# Patient Record
Sex: Female | Born: 1940 | Hispanic: Yes | Smoking: Never smoker
Health system: Southern US, Community
[De-identification: ages and names within clinical notes are randomized; demographics above are authoritative.]

## PROBLEM LIST (undated history)

## (undated) DIAGNOSIS — I700 Atherosclerosis of aorta: Secondary | ICD-10-CM

## (undated) DIAGNOSIS — Z972 Presence of dental prosthetic device (complete) (partial): Secondary | ICD-10-CM

## (undated) DIAGNOSIS — M810 Age-related osteoporosis without current pathological fracture: Secondary | ICD-10-CM

## (undated) DIAGNOSIS — E7800 Pure hypercholesterolemia, unspecified: Secondary | ICD-10-CM

## (undated) DIAGNOSIS — I739 Peripheral vascular disease, unspecified: Secondary | ICD-10-CM

## (undated) DIAGNOSIS — I10 Essential (primary) hypertension: Secondary | ICD-10-CM

## (undated) HISTORY — PX: JOINT REPLACEMENT: SHX530

## (undated) HISTORY — PX: ABDOMINAL HYSTERECTOMY: SHX81

## (undated) HISTORY — PX: CHOLECYSTECTOMY: SHX55

---

## 2019-06-27 DIAGNOSIS — I10 Essential (primary) hypertension: Secondary | ICD-10-CM | POA: Diagnosis present

## 2019-06-27 DIAGNOSIS — M810 Age-related osteoporosis without current pathological fracture: Secondary | ICD-10-CM | POA: Diagnosis present

## 2019-06-27 DIAGNOSIS — I739 Peripheral vascular disease, unspecified: Secondary | ICD-10-CM | POA: Insufficient documentation

## 2020-02-08 DIAGNOSIS — I700 Atherosclerosis of aorta: Secondary | ICD-10-CM | POA: Insufficient documentation

## 2020-02-28 ENCOUNTER — Other Ambulatory Visit: Payer: Self-pay | Admitting: Orthopedic Surgery

## 2020-02-28 DIAGNOSIS — M75101 Unspecified rotator cuff tear or rupture of right shoulder, not specified as traumatic: Secondary | ICD-10-CM

## 2020-03-07 ENCOUNTER — Other Ambulatory Visit: Payer: Self-pay | Admitting: Internal Medicine

## 2020-03-07 DIAGNOSIS — Z1231 Encounter for screening mammogram for malignant neoplasm of breast: Secondary | ICD-10-CM

## 2020-03-18 ENCOUNTER — Ambulatory Visit
Admission: RE | Admit: 2020-03-18 | Discharge: 2020-03-18 | Disposition: A | Discharge status: Home / Self Care | Payer: Medicare Other | Source: Ambulatory Visit | Attending: Orthopedic Surgery | Admitting: Orthopedic Surgery

## 2020-03-18 ENCOUNTER — Other Ambulatory Visit: Payer: Self-pay

## 2020-03-18 DIAGNOSIS — M75101 Unspecified rotator cuff tear or rupture of right shoulder, not specified as traumatic: Secondary | ICD-10-CM | POA: Insufficient documentation

## 2020-03-29 ENCOUNTER — Other Ambulatory Visit: Payer: Self-pay

## 2020-03-29 ENCOUNTER — Ambulatory Visit
Admission: RE | Admit: 2020-03-29 | Discharge: 2020-03-29 | Disposition: A | Discharge status: Home / Self Care | Payer: Medicare Other | Source: Ambulatory Visit | Attending: Internal Medicine | Admitting: Internal Medicine

## 2020-03-29 DIAGNOSIS — Z1231 Encounter for screening mammogram for malignant neoplasm of breast: Secondary | ICD-10-CM | POA: Insufficient documentation

## 2021-09-14 ENCOUNTER — Other Ambulatory Visit: Payer: Self-pay

## 2021-09-14 ENCOUNTER — Observation Stay
Admission: EM | Admit: 2021-09-14 | Discharge: 2021-09-15 | Disposition: A | Discharge status: Home / Self Care | Payer: Medicare (Managed Care) | Attending: Obstetrics and Gynecology | Admitting: Obstetrics and Gynecology

## 2021-09-14 ENCOUNTER — Encounter: Payer: Self-pay | Admitting: Emergency Medicine

## 2021-09-14 ENCOUNTER — Emergency Department: Payer: Medicare (Managed Care)

## 2021-09-14 DIAGNOSIS — U071 COVID-19: Secondary | ICD-10-CM | POA: Insufficient documentation

## 2021-09-14 DIAGNOSIS — M810 Age-related osteoporosis without current pathological fracture: Secondary | ICD-10-CM | POA: Insufficient documentation

## 2021-09-14 DIAGNOSIS — N179 Acute kidney failure, unspecified: Secondary | ICD-10-CM | POA: Diagnosis present

## 2021-09-14 DIAGNOSIS — D649 Anemia, unspecified: Secondary | ICD-10-CM | POA: Insufficient documentation

## 2021-09-14 DIAGNOSIS — I10 Essential (primary) hypertension: Secondary | ICD-10-CM | POA: Insufficient documentation

## 2021-09-14 DIAGNOSIS — R55 Syncope and collapse: Principal | ICD-10-CM

## 2021-09-14 HISTORY — DX: Essential (primary) hypertension: I10

## 2021-09-14 LAB — CBC WITH DIFFERENTIAL/PLATELET
Abs Immature Granulocytes: 0.03 10*3/uL (ref 0.00–0.07)
Basophils Absolute: 0 10*3/uL (ref 0.0–0.1)
Basophils Relative: 0 %
Eosinophils Absolute: 0 10*3/uL (ref 0.0–0.5)
Eosinophils Relative: 0 %
HCT: 36.6 % (ref 36.0–46.0)
Hemoglobin: 11.3 g/dL — ABNORMAL LOW (ref 12.0–15.0)
Immature Granulocytes: 0 %
Lymphocytes Relative: 20 %
Lymphs Abs: 1.5 10*3/uL (ref 0.7–4.0)
MCH: 23.1 pg — ABNORMAL LOW (ref 26.0–34.0)
MCHC: 30.9 g/dL (ref 30.0–36.0)
MCV: 74.7 fL — ABNORMAL LOW (ref 80.0–100.0)
Monocytes Absolute: 1.1 10*3/uL — ABNORMAL HIGH (ref 0.1–1.0)
Monocytes Relative: 15 %
Neutro Abs: 4.7 10*3/uL (ref 1.7–7.7)
Neutrophils Relative %: 65 %
Platelets: 177 10*3/uL (ref 150–400)
RBC: 4.9 MIL/uL (ref 3.87–5.11)
RDW: 15.4 % (ref 11.5–15.5)
WBC: 7.3 10*3/uL (ref 4.0–10.5)
nRBC: 0 % (ref 0.0–0.2)

## 2021-09-14 LAB — COMPREHENSIVE METABOLIC PANEL
ALT: 28 U/L (ref 0–44)
AST: 38 U/L (ref 15–41)
Albumin: 3.4 g/dL — ABNORMAL LOW (ref 3.5–5.0)
Alkaline Phosphatase: 50 U/L (ref 38–126)
Anion gap: 4 — ABNORMAL LOW (ref 5–15)
BUN: 21 mg/dL (ref 8–23)
CO2: 26 mmol/L (ref 22–32)
Calcium: 8.5 mg/dL — ABNORMAL LOW (ref 8.9–10.3)
Chloride: 108 mmol/L (ref 98–111)
Creatinine, Ser: 1.36 mg/dL — ABNORMAL HIGH (ref 0.44–1.00)
GFR, Estimated: 40 mL/min — ABNORMAL LOW (ref >60)
Glucose, Bld: 188 mg/dL — ABNORMAL HIGH (ref 70–99)
Potassium: 3.5 mmol/L (ref 3.5–5.1)
Sodium: 138 mmol/L (ref 135–145)
Total Bilirubin: 0.5 mg/dL (ref 0.3–1.2)
Total Protein: 6.5 g/dL (ref 6.5–8.1)

## 2021-09-14 LAB — TROPONIN I (HIGH SENSITIVITY)
Troponin I (High Sensitivity): 31 ng/L — ABNORMAL HIGH (ref <18)
Troponin I (High Sensitivity): 24 ng/L — ABNORMAL HIGH (ref <18)

## 2021-09-14 MED ORDER — HYDRALAZINE HCL 20 MG/ML IJ SOLN: 10.0000 mg | Freq: Once | INTRAMUSCULAR | Status: DC

## 2021-09-14 MED ORDER — LACTATED RINGERS IV SOLN: INTRAVENOUS | Status: DC

## 2021-09-14 MED ORDER — LACTATED RINGERS IV BOLUS
1000.0000 mL | Freq: Once | INTRAVENOUS | Status: AC
  Administered 2021-09-14: 1000 mL via INTRAVENOUS

## 2021-09-14 MED ORDER — HEPARIN SODIUM (PORCINE) 5000 UNIT/ML IJ SOLN
5000.0000 [IU] | Freq: Two times a day (BID) | INTRAMUSCULAR | Status: DC
  Administered 2021-09-15: 5000 [IU] via SUBCUTANEOUS
  Filled 2021-09-14: qty 1

## 2021-09-14 MED ORDER — ACETAMINOPHEN 325 MG PO TABS
650.0000 mg | ORAL_TABLET | Freq: Four times a day (QID) | ORAL | Status: DC | PRN
  Administered 2021-09-15: 650 mg via ORAL
  Filled 2021-09-14: qty 2

## 2021-09-14 MED ORDER — ACETAMINOPHEN 325 MG RE SUPP: 650.0000 mg | Freq: Four times a day (QID) | RECTAL | Status: DC | PRN

## 2021-09-14 MED ORDER — SODIUM CHLORIDE 0.9% FLUSH
3.0000 mL | Freq: Two times a day (BID) | INTRAVENOUS | Status: DC
  Administered 2021-09-15: 3 mL via INTRAVENOUS

## 2021-09-14 MED ORDER — ONDANSETRON HCL 4 MG PO TABS: 4.0000 mg | ORAL_TABLET | Freq: Four times a day (QID) | ORAL | Status: DC | PRN

## 2021-09-14 MED ORDER — PANTOPRAZOLE SODIUM 40 MG IV SOLR
40.0000 mg | Freq: Two times a day (BID) | INTRAVENOUS | Status: DC
  Administered 2021-09-15: 40 mg via INTRAVENOUS
  Filled 2021-09-14: qty 40

## 2021-09-14 MED ORDER — ONDANSETRON HCL 4 MG/2ML IJ SOLN: 4.0000 mg | Freq: Four times a day (QID) | INTRAMUSCULAR | Status: DC | PRN

## 2021-09-14 NOTE — ED Notes (Signed)
Pt given orange juice at this time per her request, approved by MD Jessup.  Resting at this time with friend at bedside. No requests at this time. Encouraged to notify RN for any needs.

## 2021-09-14 NOTE — Assessment & Plan Note (Signed)
Blood pressure (!) 105/53, pulse 68, temperature 98.4 F (36.9 C), temperature source Oral, resp. rate 16, height 5\' 4"  (1.626 m), weight 77.1 kg, SpO2 100 %. Patient presenting with syncope and collapse her third episode in the past 2 years. Patient also reports associated vision loss with her episodes.  Today she was at the dinner table where she started to stare off into space and passed out and was not aware.  When she woke up with a headache.  Daughter was there and called EMS. Chart reviewed and patient's history reviewed medications reviewed blood work reviewed and differentials in this patient include syncope and collapse related to hypotension, or cardiac event, or pulmonary embolism, or CVA, or possible chronic bleeding. We will evaluate patient with neuroimaging and echocardiogram along with V/Q scan to evaluate for pulmonary embolism.  We will repeat a stat creatinine if it has improved to with volume hydration we will pursue CT angio of chest and neck. Physical therapy consult fall precaution aspiration precaution

## 2021-09-14 NOTE — H&P (Signed)
History and Physical    Patient: Anna Campbell BTD:[id number] DOB: 02/24/1941 DOA: 09/14/2021 DOS: the patient was seen and examined on 09/14/2021 PCP: Lauro Regulus, MD  Patient coming from: Home  Chief Complaint:  Chief Complaint  Patient presents with   Loss of Consciousness    HPI: Anna Campbell is a 81 y.o. female with medical history significant of htn. Spanish interpreter: Cc: passed out , has happened in past, total 3.  Pt denies any prodromal symptoms. But does report vision loss first two times , brightness for first two times , vision loss lasted 5 minutes.   Today she was pt was having soup felt her body was weak and then she does not remembers anything.   No sign and symptoms when she woke up except for headache. Frontal area headache lasted 1/3 . Pt did report fever and chills, cough and mucus.   Friday pm she had fever and passed out today.   Review of Systems:  Review of Systems  Neurological:  Positive for loss of consciousness.   Past Medical History:  Diagnosis Date   Hypertension    Past Surgical History:  Procedure Laterality Date   CHOLECYSTECTOMY     JOINT REPLACEMENT     Social History:  reports that she has never smoked. She has never used smokeless tobacco. She reports that she does not drink alcohol and does not use drugs.  Allergies  Allergen Reactions   Penicillin G Anaphylaxis    Family History  Problem Relation Age of Onset   Hypertension Mother     Prior to Admission medications   Not on File    Physical Exam: Vitals:   09/14/21 2049 09/14/21 2130 09/14/21 2200 09/14/21 2214  BP:  (!) 121/58 (!) 105/53 (!) 105/53  Pulse:  72 71 68  Resp:    16  Temp:      TempSrc:      SpO2:  98% 100% 100%  Weight: 77.1 kg     Height: 5\' 4"  (1.626 m)     Physical Exam Vitals reviewed.  Constitutional:      General: She is not in acute distress.    Appearance: Normal appearance. She is not ill-appearing.  HENT:     Head:  Normocephalic and atraumatic.     Right Ear: External ear normal.     Left Ear: External ear normal.     Nose: Nose normal.     Mouth/Throat:     Mouth: Mucous membranes are moist.  Eyes:     Extraocular Movements: Extraocular movements intact.     Pupils: Pupils are equal, round, and reactive to light.  Neck:     Vascular: No carotid bruit.  Cardiovascular:     Rate and Rhythm: Normal rate and regular rhythm.     Pulses: Normal pulses.     Heart sounds: Normal heart sounds.  Pulmonary:     Effort: Pulmonary effort is normal.     Breath sounds: Normal breath sounds.  Abdominal:     General: Bowel sounds are normal. There is no distension.     Palpations: Abdomen is soft. There is no mass.     Tenderness: There is no abdominal tenderness. There is no guarding.     Hernia: No hernia is present.  Musculoskeletal:     Right lower leg: No edema.     Left lower leg: No edema.  Skin:    General: Skin is warm.  Neurological:     General:  No focal deficit present.     Mental Status: She is alert and oriented to person, place, and time.     Cranial Nerves: Cranial nerve deficit present.     Motor: Weakness present.     Deep Tendon Reflexes: Reflexes abnormal.  Psychiatric:        Mood and Affect: Mood normal.        Behavior: Behavior normal.   Data Reviewed: > CMP shows glucose 188 and abnormal creatinine of 1.36.  > TNI of 24.  > CBC shows normal abc count, hb of 11.3 and glucose of 188.   Assessment and Plan: * Syncope and collapse- (present on admission) Blood pressure (!) 105/53, pulse 68, temperature 98.4 F (36.9 C), temperature source Oral, resp. rate 16, height 5\' 4"  (1.626 m), weight 77.1 kg, SpO2 100 %. Patient presenting with syncope and collapse her third episode in the past 2 years. Patient also reports associated vision loss with her episodes.  Today she was at the dinner table where she started to stare off into space and passed out and was not aware.  When she  woke up with a headache.  Daughter was there and called EMS. Chart reviewed and patient's history reviewed medications reviewed blood work reviewed and differentials in this patient include syncope and collapse related to hypotension, or cardiac event, or pulmonary embolism, or CVA, or possible chronic bleeding. We will evaluate patient with neuroimaging and echocardiogram along with V/Q scan to evaluate for pulmonary embolism.  We will repeat a stat creatinine if it has improved to with volume hydration we will pursue CT angio of chest and neck. Physical therapy consult fall precaution aspiration precaution  Anemia- (present on admission) Pt has anemia and is on alendronate.  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 obtain anemia panel, stool occult.  Iv ppi./ type and screen   AKI (acute kidney injury) (HCC)- (present on admission) Lab Results  Component Value Date   CREATININE 1.36 (H) 09/14/2021  Pt has abnormal creatinine and no prior to compare however she says she has not been told of her kidney being abnormal.   Essential hypertension- (present on admission) Blood pressure (!) 105/53, pulse 68, temperature 98.4 F (36.9 C), temperature source Oral, resp. rate 16, height 5\' 4"  (1.626 m), weight 77.1 kg, SpO2 100 %. Patient's blood pressure is low we will hold home bp meds and follow.     Advance Care Planning:   Code Status: Full Code   Consults: None   Family Communication: 11/12/2021 (Daughter)  [phone] (Mobile)  Severity of Illness: The appropriate patient status for this patient is INPATIENT. Inpatient status is judged to be reasonable and necessary in order to provide the required intensity of service to ensure the patient's safety. The patient's presenting symptoms, physical exam findings, and initial radiographic and laboratory data in the context of their chronic comorbidities is felt to place them at high risk for  further clinical deterioration. Furthermore, it is not anticipated that the patient will be medically stable for discharge from the hospital within 2 midnights of admission.   * I certify that at the point of admission it is my clinical judgment that the patient will require inpatient hospital care spanning beyond 2 midnights from the point of admission due to high intensity of service, high risk for further deterioration and high frequency of surveillance required.*  Author: Ennis Forts, MD 09/14/2021 11:42 PM  For on call review  [email or web address]

## 2021-09-14 NOTE — Assessment & Plan Note (Signed)
Pt has anemia and is on alendronate.  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 obtain anemia panel, stool occult.  Iv ppi./ type and screen

## 2021-09-14 NOTE — ED Notes (Signed)
Pt resting with family at bedside, warm blankets given per her request.

## 2021-09-14 NOTE — Assessment & Plan Note (Signed)
Lab Results  Component Value Date   CREATININE 1.36 (H) 09/14/2021  Pt has abnormal creatinine and no prior to compare however she says she has not been told of her kidney being abnormal.

## 2021-09-14 NOTE — ED Triage Notes (Signed)
Pt arrives via [facility], C/O syncope x 2 this evening. No hx syncope. Pt reports feeling dizzy while eating and then passing out.  Reports returning from [facility] yesterday and having cough and not feeling well since Thursday. Has not taken temp at home.Pt reports constipation and is concerned that she took too much laxative.

## 2021-09-14 NOTE — ED Provider Notes (Signed)
[facility] Provider Note    Event Date/Time   First MD Initiated Contact with Patient 09/14/21 2041     (approximate)   History   Chief Complaint Loss of Consciousness   HPI  Anna Campbell is a 81 y.o. female with past medical history of hypertension, hyperlipidemia, and peripheral vascular disease who presents to the ED complaining of syncope.  History is limited as patient is Spanish-speaking only, history obtained via interpreter [phone].  Patient reports that she began to feel lightheaded and thinks she might have passed out just prior to arrival.  EMS reports that they were called by patient's daughter, who witnessed patient have a syncopal episode lasting for a few seconds.  Patient denies any chest pain or shortness of breath with the episode, now states that she is feeling better with no lightheadedness.  She does state that she has been ill recently with a productive cough over the past 3 days.  She has been dealing with subjective fevers and chills, but has not taken her temperature.  She recently got back from [place] last night but is not aware of any specific sick contacts.  She has not had any nausea, vomiting, abdominal pain, dysuria, or diarrhea.     Physical Exam   Triage Vital Signs: ED Triage Vitals [09/14/21 2037]  Enc Vitals Group     BP      Pulse      Resp      Temp      Temp src      SpO2 98 %     Weight      Height      Head Circumference      Peak Flow      Pain Score      Pain Loc      Pain Edu?      Excl. in GC?     Most recent vital signs: Vitals:   09/14/21 2200 09/14/21 2214  BP: (!) 105/53 (!) 105/53  Pulse: 71 68  Resp:  16  Temp:    SpO2: 100% 100%    Constitutional: Alert and oriented. Eyes: Conjunctivae are normal. Head: Atraumatic. Nose: No congestion/rhinnorhea. Mouth/Throat: Mucous membranes are moist.  Cardiovascular: Normal rate, regular rhythm. Grossly normal heart sounds.  2+ radial pulses  bilaterally. Respiratory: Normal respiratory effort.  No retractions. Lungs CTAB. Gastrointestinal: Soft and nontender. No distention. Musculoskeletal: No lower extremity tenderness nor edema.  Neurologic:  Normal speech and language. No gross focal neurologic deficits are appreciated.    ED Results / Procedures / Treatments   Labs (all labs ordered are listed, but only abnormal results are displayed) Labs Reviewed  CBC WITH DIFFERENTIAL/PLATELET - Abnormal; Notable for the following components:      Result Value   Hemoglobin 11.3 (*)    MCV 74.7 (*)    MCH 23.1 (*)    Monocytes Absolute 1.1 (*)    All other components within normal limits  COMPREHENSIVE METABOLIC PANEL - Abnormal; Notable for the following components:   Glucose, Bld 188 (*)    Creatinine, Ser 1.36 (*)    Calcium 8.5 (*)    Albumin 3.4 (*)    GFR, Estimated 40 (*)    Anion gap 4 (*)    All other components within normal limits  TROPONIN I (HIGH SENSITIVITY) - Abnormal; Notable for the following components:   Troponin I (High Sensitivity) 31 (*)    All other components within normal limits  RESP  PANEL BY RT-PCR (FLU A&B, COVID) [id number]  TROPONIN I (HIGH SENSITIVITY)     EKG  ED ECG REPORT I, Chesley Noon, the attending physician, personally viewed and interpreted this ECG.   Date: 09/14/2021  EKG Time: 20:46  Rate: 74  Rhythm: normal sinus rhythm  Axis: Normal  Intervals:none  ST&T Change: None  RADIOLOGY Chest x-ray reviewed by me with no infiltrate, edema, or effusion.  PROCEDURES:  Critical Care performed: No  .1-3 Lead EKG Interpretation Performed by: Chesley Noon, MD Authorized by: Chesley Noon, MD     Interpretation: normal     ECG rate:  65-80   ECG rate assessment: normal     Rhythm: sinus rhythm     Ectopy: none     Conduction: normal     MEDICATIONS ORDERED IN ED: Medications  lactated ringers bolus 1,000 mL (1,000 mLs Intravenous New Bag/Given 09/14/21 2141)      IMPRESSION / MDM / ASSESSMENT AND PLAN / ED COURSE  I reviewed the triage vital signs and the nursing notes.                              81 y.o. female with past medical history of hypertension, hyperlipidemia, and peripheral vascular disease who presents to the ED complaining of lightheadedness and syncopal episode earlier this evening after patient has been dealing with productive cough and productive fevers over the past couple of days.  Differential diagnosis includes, but is not limited to, arrhythmia, ACS, pneumonia, dehydration, electrolyte abnormality, bronchitis, COVID-19.  Patient is nontoxic-appearing and in no acute distress, vital signs are reassuring.  We will screen EKG and labs, observe on cardiac monitor for potential cardiac etiology of syncope.  Also check chest x-ray given her recent subjective fevers and productive cough, will also test for COVID-19 and influenza.  Dehydration may be a component of her symptoms given recent illness and poor p.o. intake, we will hydrate with IV fluids and reassess.  Labs unremarkable for AKI and mildly elevated troponin, likely secondary to AKI.  CBC is reassuring with no leukocytosis and no events noted on cardiac monitor.  We will hydrate with IV fluids and case discussed with hospitalist for further assessment of syncope and AKI.  The patient is on the cardiac monitor to evaluate for evidence of arrhythmia and/or significant heart rate changes.      FINAL CLINICAL IMPRESSION(S) / ED DIAGNOSES   Final diagnoses:  Syncope, unspecified syncope type  AKI (acute kidney injury) (HCC)     Rx / DC Orders   ED Discharge Orders     None        Note:  This document was prepared using Dragon voice recognition software and may include unintentional dictation errors.   Chesley Noon, MD 09/14/21 2237

## 2021-09-14 NOTE — H&P (Incomplete)
°  History and Physical    Patient: Anna Campbell AYO:[id number] DOB: 1940/10/09 DOA: 09/14/2021 DOS: the patient was seen and examined on 09/14/2021 PCP: Lauro Regulus, MD  Patient coming from: Home  Chief Complaint:  Chief Complaint  Patient presents with   Loss of Consciousness    HPI: Anna Campbell is a 81 y.o. female with medical history significant of ***   Spanish interpreter: Cc: passed out , has happened in past, total 3.  Pt denies any prodromal symptoms. But does report vision loss first two times , brightness for first two times , vision loss lasted 5 minutes.   Today she was pt was having soup felt her body was weak and then she does not remembers anything.   No sign and symptoms when she woke up except for headache. Frontal area headache lasted 1/3 . Pt did report fever and chills, cough and mucus.   Friday pm she had fever and passed out today.       Review of Systems: {ROS_Text:26778} Past Medical History:  Diagnosis Date   Hypertension    Past Surgical History:  Procedure Laterality Date   CHOLECYSTECTOMY     JOINT REPLACEMENT     Social History:  reports that she has never smoked. She has never used smokeless tobacco. She reports that she does not drink alcohol and does not use drugs.  Allergies  Allergen Reactions   Penicillin G Anaphylaxis    History reviewed. No pertinent family history.  Prior to Admission medications   Not on File    Physical Exam: Vitals:   09/14/21 2049 09/14/21 2130 09/14/21 2200 09/14/21 2214  BP:  (!) 121/58 (!) 105/53 (!) 105/53  Pulse:  72 71 68  Resp:    16  Temp:      TempSrc:      SpO2:  98% 100% 100%  Weight: 77.1 kg     Height: 5\' 4"  (1.626 m)      ***  Data Reviewed: {Tip this will not be part of the note when signed- Document your independent interpretation of telemetry tracing, EKG, lab, Radiology test or any other diagnostic tests. Add any new diagnostic test ordered  today. (Optional):26781} {Results:26384}  Assessment and Plan: No notes have been filed under this hospital service. Service: Hospitalist      Advance Care Planning:   Code Status: Not on file ***  Consults: ***  Family Communication: ***  Severity of Illness: {Observation/Inpatient:21159}  Author: , MD 09/14/2021 10:29 PM  For on call review [email or web address].

## 2021-09-14 NOTE — Assessment & Plan Note (Addendum)
Blood pressure (!) 105/53, pulse 68, temperature 98.4 F (36.9 C), temperature source Oral, resp. rate 16, height 5\' 4"  (1.626 m), weight 77.1 kg, SpO2 100 %. Patient's blood pressure is low we will hold home bp meds and follow.

## 2021-09-15 ENCOUNTER — Inpatient Hospital Stay: Payer: Medicare (Managed Care)

## 2021-09-15 DIAGNOSIS — R55 Syncope and collapse: Secondary | ICD-10-CM | POA: Diagnosis present

## 2021-09-15 LAB — RESP PANEL BY RT-PCR (FLU A&B, COVID) ARPGX2
Influenza A by PCR: NEGATIVE
Influenza B by PCR: NEGATIVE
SARS Coronavirus 2 by RT PCR: POSITIVE — AB

## 2021-09-15 LAB — HEMOGLOBIN A1C
Hgb A1c MFr Bld: 5.5 % (ref 4.8–5.6)
Mean Plasma Glucose: 111.15 mg/dL

## 2021-09-15 LAB — FERRITIN: Ferritin: 103 ng/mL (ref 11–307)

## 2021-09-15 LAB — VITAMIN B12: Vitamin B-12: 535 pg/mL (ref 180–914)

## 2021-09-15 LAB — BASIC METABOLIC PANEL
Anion gap: 3 — ABNORMAL LOW (ref 5–15)
BUN: 15 mg/dL (ref 8–23)
CO2: 26 mmol/L (ref 22–32)
Calcium: 8.5 mg/dL — ABNORMAL LOW (ref 8.9–10.3)
Chloride: 111 mmol/L (ref 98–111)
Creatinine, Ser: 0.75 mg/dL (ref 0.44–1.00)
GFR, Estimated: >60 mL/min (ref >60)
Glucose, Bld: 96 mg/dL (ref 70–99)
Potassium: 4.1 mmol/L (ref 3.5–5.1)
Sodium: 140 mmol/L (ref 135–145)

## 2021-09-15 LAB — CBC
HCT: 34.1 % — ABNORMAL LOW (ref 36.0–46.0)
Hemoglobin: 10.6 g/dL — ABNORMAL LOW (ref 12.0–15.0)
MCH: 22.6 pg — ABNORMAL LOW (ref 26.0–34.0)
MCHC: 31.1 g/dL (ref 30.0–36.0)
MCV: 72.7 fL — ABNORMAL LOW (ref 80.0–100.0)
Platelets: 202 10*3/uL (ref 150–400)
RBC: 4.69 MIL/uL (ref 3.87–5.11)
RDW: 15.7 % — ABNORMAL HIGH (ref 11.5–15.5)
WBC: 5.9 10*3/uL (ref 4.0–10.5)
nRBC: 0 % (ref 0.0–0.2)

## 2021-09-15 LAB — RETICULOCYTES
Immature Retic Fract: 7.6 % (ref 2.3–15.9)
RBC.: 4.91 MIL/uL (ref 3.87–5.11)
Retic Count, Absolute: 42.2 10*3/uL (ref 19.0–186.0)
Retic Ct Pct: 0.9 % (ref 0.4–3.1)

## 2021-09-15 LAB — IRON AND TIBC
Iron: 23 ug/dL — ABNORMAL LOW (ref 28–170)
Saturation Ratios: 9 % — ABNORMAL LOW (ref 10.4–31.8)
TIBC: 251 ug/dL (ref 250–450)
UIBC: 228 ug/dL

## 2021-09-15 LAB — FOLATE: Folate: 50 ng/mL (ref >5.9)

## 2021-09-15 LAB — TROPONIN I (HIGH SENSITIVITY)
Troponin I (High Sensitivity): 23 ng/L — ABNORMAL HIGH (ref <18)
Troponin I (High Sensitivity): 20 ng/L — ABNORMAL HIGH (ref <18)

## 2021-09-15 LAB — TSH: TSH: 1.724 u[IU]/mL (ref 0.350–4.500)

## 2021-09-15 LAB — BRAIN NATRIURETIC PEPTIDE: B Natriuretic Peptide: 11.2 pg/mL (ref 0.0–100.0)

## 2021-09-15 MED ORDER — NIRMATRELVIR/RITONAVIR (PAXLOVID)TABLET: 3.0000 | ORAL_TABLET | Freq: Two times a day (BID) | ORAL | 0 refills | Status: AC

## 2021-09-15 NOTE — Discharge Summary (Signed)
Anna Campbell [id number] DOB: 03/28/1941 DOA: 09/14/2021  PCP: Kirk Ruths, MD  Admit date: 09/14/2021 Discharge date: 09/15/2021  Time spent: 45 minutes  Recommendations for Outpatient Follow-up:  PCP f/u     Discharge Diagnoses:  Principal Problem:   Syncope and collapse Active Problems:   Essential hypertension   Osteoporosis   AKI (acute kidney injury) ([place])   Anemia   Syncope   Discharge Condition: improved  Diet recommendation: regular  Filed Weights   09/14/21 2049  Weight: 77.1 kg    History of present illness:  From hpi by dr. Charlies Campbell is a 81 y.o. female with medical history significant of htn. Spanish interpreter: Cc: passed out , has happened in past, total 3.  Pt denies any prodromal symptoms. But does report vision loss first two times , brightness for first two times , vision loss lasted 5 minutes.    Today she was pt was having soup felt her body was weak and then she does not remembers anything.    No sign and symptoms when she woke up except for headache. Frontal area headache lasted 1/3 . Pt did report fever and chills, cough and mucus.    Friday pm she had fever and passed out today  Hospital Course:  Patient presented with several days cough and subjective fever and brief syncope x2 day of arrival. Found to be covid positive with AKI. Treated with fluid resuscitation and symptoms improved, ambulated without difficulty. AKI resolved. Lungs clear, no hypoxia or signs pneumonia on CXR. No vomiting or diarrhea. Will d/c home with paxlovid rx, push fluids, and return precautions. Hold statin while on paxlovid and hold antihypertensives until feeling better.  Procedures: none   Consultations: none  Discharge Exam: Vitals:   09/15/21 0700 09/15/21 0800  BP: (!) 148/74 (!) 146/84  Pulse: 70 84  Resp: 18 18  Temp:    SpO2: 99% 98%    General: NAD Cardiovascular: RRR Respiratory: CTAB  Discharge  Instructions   Discharge Instructions     Diet - low sodium heart healthy   Complete by: As directed    Increase activity slowly   Complete by: As directed       Allergies as of 09/15/2021       Reactions   Penicillin G Anaphylaxis        Medication List     STOP taking these medications    atorvastatin 40 MG tablet Commonly known as: LIPITOR   hydrochlorothiazide 25 MG tablet Commonly known as: HYDRODIURIL   losartan 50 MG tablet Commonly known as: COZAAR       TAKE these medications    alendronate 35 MG tablet Commonly known as: FOSAMAX Take 35 mg by mouth once a week. Take 1 tablet (35 mg total) by mouth every 7 (seven) days Take with a full glass of water. Do not lie down for the next 30 min.   folic acid 1 MG tablet Commonly known as: FOLVITE Take 1 mg by mouth daily.   nirmatrelvir/ritonavir EUA 20 x 150 MG & 10 x 100MG  Tabs Commonly known as: PAXLOVID Take 3 tablets by mouth 2 (two) times daily for 5 days. Patient GFR is greater than 60. Take nirmatrelvir (150 mg) two tablets twice daily for 5 days and ritonavir (100 mg) one tablet twice daily for 5 days.       Allergies  Allergen Reactions   Penicillin G Anaphylaxis    Follow-up Information     Your primary  care provider at [facility] Follow up.                   The results of significant diagnostics from this hospitalization (including imaging, microbiology, ancillary and laboratory) are listed below for reference.    Significant Diagnostic Studies: DG Chest 2 View  Result Date: 09/14/2021 CLINICAL DATA:  Weakness EXAM: CHEST - 2 VIEW COMPARISON:  None. FINDINGS: Shallow inspiration. Normal heart size and pulmonary vascularity. No focal airspace disease or consolidation in the lungs. No blunting of costophrenic angles. No pneumothorax. Mediastinal contours appear intact. Degenerative changes in the spine and shoulders. IMPRESSION: No active cardiopulmonary disease.  Electronically Signed   By: Lucienne Capers M.D.   On: 09/14/2021 21:22   MR BRAIN WO CONTRAST  Result Date: 09/15/2021 CLINICAL DATA:  Syncope/presyncope EXAM: MRI HEAD WITHOUT CONTRAST TECHNIQUE: Multiplanar, multiecho pulse sequences of the brain and surrounding structures were obtained without intravenous contrast. COMPARISON:  None. FINDINGS: Brain: No restricted diffusion to suggest acute or subacute infarct. No acute hemorrhage mass, mass effect, or midline shift. No hydrocephalus or extra-axial collection. Scattered T2 hyperintense signal in the periventricular white matter, likely the sequela of chronic small vessel ischemic disease. 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 Similar increased T2 signal and decreased T1 signal in a smaller area in the superior left cerebellum, also concerning for prior infarct. Vascular: Normal flow voids. Skull and upper cervical spine: Normal marrow signal. Sinuses/Orbits: Mild mucosal thickening in the maxillary sinuses and ethmoid air cells. The orbits are unremarkable. Other: Trace fluid in the mastoid air cells. IMPRESSION: 1. 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 2. No acute infarct or other intracranial process. Electronically Signed   By: Merilyn Baba M.D.   On: 09/15/2021 01:02    Microbiology: Recent Results (from the past 240 hour(s))  Resp Panel by RT-PCR (Flu A&B, Covid) Nasopharyngeal Swab     Status: Abnormal   Collection Time: 09/14/21  8:39 PM   Specimen: Nasopharyngeal Swab; Nasopharyngeal(NP) swabs in vial transport medium  Result Value Ref Range Status   SARS Coronavirus 2 by RT PCR POSITIVE (A) NEGATIVE Final    Comment: (NOTE) SARS-CoV-2 target nucleic acids are DETECTED.  The SARS-CoV-2 RNA is generally detectable in upper respiratory specimens during the acute phase of infection.  Positive results are indicative of the presence of the identified virus, but do not rule out bacterial infection or co-infection with other pathogens not detected by the test. Clinical correlation with patient history and other diagnostic information is necessary to determine patient infection status. The expected result is Negative.  Fact Sheet for Patients: EntrepreneurPulse.com.au  Fact Sheet for Healthcare Providers: IncredibleEmployment.be  This test is not yet approved or cleared by the [place] FDA and  has been authorized for detection and/or diagnosis of SARS-CoV-2 by FDA under an Emergency Use Authorization (EUA).  This EUA will remain in effect (meaning this test can be used) for the duration of  the COVID-19 declaration under Section 564(b)(1) of the A ct, 21 U.S.C. section 360bbb-3(b)(1), unless the authorization is terminated or revoked sooner.     Influenza A by PCR NEGATIVE NEGATIVE Final   Influenza B by PCR NEGATIVE NEGATIVE Final    Comment: (NOTE) The Xpert Xpress SARS-CoV-2/FLU/RSV plus assay is intended as an aid in the diagnosis of influenza from Nasopharyngeal swab specimens and should not be used as a sole  basis for treatment. Nasal washings and aspirates are unacceptable for Xpert Xpress SARS-CoV-2/FLU/RSV testing.  Fact Sheet for Patients: EntrepreneurPulse.com.au  Fact Sheet for Healthcare Providers: IncredibleEmployment.be  This test is not yet approved or cleared by the [place] FDA and has been authorized for detection and/or diagnosis of SARS-CoV-2 by FDA under an Emergency Use Authorization (EUA). This EUA will remain in effect (meaning this test can be used) for the duration of the COVID-19 declaration under Section 564(b)(1) of the Act, 21 U.S.C. section 360bbb-3(b)(1), unless the authorization is terminated or revoked.  Performed at [facility],  [address] [place], [place] [address]      Labs: Basic Metabolic Panel: Recent Labs  Lab 09/14/21 2039 09/15/21 0402  NA 138 140  K 3.5 4.1  CL 108 111  CO2 26 26  GLUCOSE 188* 96  BUN 21 15  CREATININE 1.36* 0.75  CALCIUM 8.5* 8.5*   Liver Function Tests: Recent Labs  Lab 09/14/21 2039  AST 38  ALT 28  ALKPHOS 50  BILITOT 0.5  PROT 6.5  ALBUMIN 3.4*   No results for input(s): LIPASE, AMYLASE in the last 168 hours. No results for input(s): AMMONIA in the last 168 hours. CBC: Recent Labs  Lab 09/14/21 2039 09/15/21 0402  WBC 7.3 5.9  NEUTROABS 4.7  --   HGB 11.3* 10.6*  HCT 36.6 34.1*  MCV 74.7* 72.7*  PLT 177 202   Cardiac Enzymes: No results for input(s): CKTOTAL, CKMB, CKMBINDEX, TROPONINI in the last 168 hours. BNP: BNP (last 3 results) Recent Labs    09/14/21 2039  BNP 11.2    ProBNP (last 3 results) No results for input(s): PROBNP in the last 8760 hours.  CBG: No results for input(s): GLUCAP in the last 168 hours.     Signed:  Desma Maxim MD.  [facility] 09/15/2021, 10:28 AM

## 2021-09-15 NOTE — ED Notes (Signed)
Pt brought back from MRI at this time.  

## 2021-09-15 NOTE — ED Notes (Signed)
Admission MD at bedside.  

## 2021-09-15 NOTE — ED Notes (Signed)
Pt is covid positive, some lab results have been delayed in the lab but I have spoken with them and they're working on them now.  IPMD Patel notified.

## 2021-09-15 NOTE — Progress Notes (Signed)
PT Cancellation Note  Patient Details Name: Anna Campbell MRN: [id number] DOB: 05/11/1941   Cancelled Treatment:    Reason Eval/Treat Not Completed: PT screened, no needs identified, will sign off. Per RN, patient has no PT needs and is discharging today. Will sign off.    Renarda Mullinix 09/15/2021, 9:48 AM

## 2021-09-15 NOTE — ED Notes (Signed)
Pt given breakfast.

## 2021-09-15 NOTE — ED Notes (Signed)
RN at bedside to obtain ambulatory oxygen saturation. Pt oxygen maintained 97-100% on RA. Pt denies any complaints. MD messaged.

## 2021-11-11 ENCOUNTER — Other Ambulatory Visit: Payer: Self-pay | Admitting: Internal Medicine

## 2021-11-11 DIAGNOSIS — Z1231 Encounter for screening mammogram for malignant neoplasm of breast: Secondary | ICD-10-CM

## 2022-02-22 ENCOUNTER — Emergency Department
Admission: EM | Admit: 2022-02-22 | Discharge: 2022-02-22 | Disposition: A | Discharge status: Home / Self Care | Payer: Medicare Other | Attending: Emergency Medicine | Admitting: Emergency Medicine

## 2022-02-22 ENCOUNTER — Encounter: Payer: Self-pay | Admitting: Emergency Medicine

## 2022-02-22 ENCOUNTER — Emergency Department: Payer: Medicare Other

## 2022-02-22 ENCOUNTER — Other Ambulatory Visit: Payer: Self-pay

## 2022-02-22 DIAGNOSIS — I10 Essential (primary) hypertension: Secondary | ICD-10-CM | POA: Diagnosis not present

## 2022-02-22 DIAGNOSIS — R55 Syncope and collapse: Secondary | ICD-10-CM

## 2022-02-22 DIAGNOSIS — E860 Dehydration: Secondary | ICD-10-CM | POA: Insufficient documentation

## 2022-02-22 DIAGNOSIS — I959 Hypotension, unspecified: Secondary | ICD-10-CM | POA: Diagnosis not present

## 2022-02-22 LAB — COMPREHENSIVE METABOLIC PANEL
ALT: 29 U/L (ref 0–44)
AST: 33 U/L (ref 15–41)
Albumin: 3.9 g/dL (ref 3.5–5.0)
Alkaline Phosphatase: 55 U/L (ref 38–126)
Anion gap: 8 (ref 5–15)
BUN: 27 mg/dL — ABNORMAL HIGH (ref 8–23)
CO2: 25 mmol/L (ref 22–32)
Calcium: 9.3 mg/dL (ref 8.9–10.3)
Chloride: 104 mmol/L (ref 98–111)
Creatinine, Ser: 1.43 mg/dL — ABNORMAL HIGH (ref 0.44–1.00)
GFR, Estimated: 37 mL/min — ABNORMAL LOW (ref >60)
Glucose, Bld: 150 mg/dL — ABNORMAL HIGH (ref 70–99)
Potassium: 2.9 mmol/L — ABNORMAL LOW (ref 3.5–5.1)
Sodium: 137 mmol/L (ref 135–145)
Total Bilirubin: 0.5 mg/dL (ref 0.3–1.2)
Total Protein: 7.3 g/dL (ref 6.5–8.1)

## 2022-02-22 LAB — URINALYSIS, ROUTINE W REFLEX MICROSCOPIC
Bilirubin Urine: NEGATIVE
Glucose, UA: NEGATIVE mg/dL
Hgb urine dipstick: NEGATIVE
Ketones, ur: NEGATIVE mg/dL
Nitrite: NEGATIVE
Protein, ur: NEGATIVE mg/dL
Specific Gravity, Urine: <1.005 — ABNORMAL LOW (ref 1.005–1.030)
pH: 6.5 (ref 5.0–8.0)

## 2022-02-22 LAB — CBC WITH DIFFERENTIAL/PLATELET
Abs Immature Granulocytes: 0.03 10*3/uL (ref 0.00–0.07)
Basophils Absolute: 0 10*3/uL (ref 0.0–0.1)
Basophils Relative: 0 %
Eosinophils Absolute: 0 10*3/uL (ref 0.0–0.5)
Eosinophils Relative: 0 %
HCT: 38.6 % (ref 36.0–46.0)
Hemoglobin: 11.6 g/dL — ABNORMAL LOW (ref 12.0–15.0)
Immature Granulocytes: 0 %
Lymphocytes Relative: 37 %
Lymphs Abs: 3.6 10*3/uL (ref 0.7–4.0)
MCH: 22.5 pg — ABNORMAL LOW (ref 26.0–34.0)
MCHC: 30.1 g/dL (ref 30.0–36.0)
MCV: 74.8 fL — ABNORMAL LOW (ref 80.0–100.0)
Monocytes Absolute: 0.9 10*3/uL (ref 0.1–1.0)
Monocytes Relative: 9 %
Neutro Abs: 5.3 10*3/uL (ref 1.7–7.7)
Neutrophils Relative %: 54 %
Platelets: 224 10*3/uL (ref 150–400)
RBC: 5.16 MIL/uL — ABNORMAL HIGH (ref 3.87–5.11)
RDW: 15.6 % — ABNORMAL HIGH (ref 11.5–15.5)
WBC: 9.9 10*3/uL (ref 4.0–10.5)
nRBC: 0 % (ref 0.0–0.2)

## 2022-02-22 LAB — URINALYSIS, MICROSCOPIC (REFLEX)
Bacteria, UA: NONE SEEN
RBC / HPF: NONE SEEN RBC/hpf (ref 0–5)

## 2022-02-22 LAB — PROTIME-INR
INR: 1.1 (ref 0.8–1.2)
Prothrombin Time: 14 seconds (ref 11.4–15.2)

## 2022-02-22 LAB — TROPONIN I (HIGH SENSITIVITY)
Troponin I (High Sensitivity): 8 ng/L (ref <18)
Troponin I (High Sensitivity): 7 ng/L (ref <18)

## 2022-02-22 MED ORDER — SODIUM CHLORIDE 0.9 % IV BOLUS
1000.0000 mL | Freq: Once | INTRAVENOUS | Status: AC
  Administered 2022-02-22: 1000 mL via INTRAVENOUS

## 2022-02-22 NOTE — ED Notes (Signed)
ED Provider at bedside. 

## 2022-02-22 NOTE — ED Triage Notes (Signed)
Pt via EMS from home. Pt states she has had a syncopal episode today. States she was unconsciousness for about 5 mins. Denies head injury. States that she she was with her family and they witnessed her become silent and pass out. Denies CP/SOB.  Pt here for hypotension. This RN did not receive report from EMS but pt states she did give her IV fluids. Pt is A&OX4 and NAD.  Spanish interpreter needed.

## 2022-02-22 NOTE — ED Notes (Signed)
Pt brought to ED rm 9 at this time. This RN now assuming care.

## 2022-02-22 NOTE — Discharge Instructions (Signed)
As we discussed please drink plenty of fluids over the next 2 days.  Please follow-up with your doctor in 2 days for recheck/reevaluation as well as recheck of your kidney function.    Return to the emergency department for any further episodes of dizziness, lightheadedness or if you feel like you are going to pass out.    Please call the number provided for cardiology to arrange a follow-up appointment for consideration of Holter monitor testing.

## 2022-02-22 NOTE — ED Provider Triage Note (Signed)
Emergency Medicine Provider Triage Evaluation Note  Anna Campbell , a 81 y.o. female  was evaluated in triage.  Pt complains of of a syncopal episode today.  Patient presents to the ED after having a brief period of syncope.  Patient reports that she did not fall or sustain other injury during the syncopal episode.  Currently denies any pain.  Patient states that the last time she passed out she had COVID.[name]  Review of Systems  Positive: Syncope Negative: Chest pain, shortness of breath, headache, visual changes, trauma  Physical Exam  BP (!) 110/44   Pulse 67   Temp 97.9 F (36.6 C) (Oral)   Resp 18   Ht 5\' 4"  (1.626 m)   Wt 79.4 kg   SpO2 98%   BMI 30.04 kg/m  Gen:   Awake, no distress   Resp:  Normal effort  MSK:   Moves extremities without difficulty  Other:  Neurologically intact at this time  Medical Decision Making  Medically screening exam initiated at 6:34 PM.  Appropriate orders placed.  [name] was informed that the remainder of the evaluation will be completed by another provider, this initial triage assessment does not replace that evaluation, and the importance of remaining in the ED until their evaluation is complete.  Patient presents to the ED with a nontraumatic episode of syncope.  No preceding symptoms.  Patient denies any pain or other complaints at this time.  Loss of consciousness was reportedly roughly 5 minutes.  Patient will have labs, urinalysis, EKG and chest x-ray, CT scan.   [name], PA-C 02/22/22 1834

## 2022-02-22 NOTE — ED Provider Notes (Signed)
[facility] Provider Note    Event Date/Time   First MD Initiated Contact with Patient 02/22/22 2143     (approximate)  Spanish interpreter used for this evaluation.  History   Chief Complaint: Hypotension  HPI  Anna Campbell is a 81 y.o. female with a past medical history of hypertension presents to the emergency department for a syncopal episode.  According to the patient for the past few weeks or months she has been experiencing episodes where her vision seems to them and then will come back.  Patient states occasionally she feels lightheaded during these episodes as well.  She states today she was having an episode and she believes she completely lost consciousness.  Patient denies any chest pain or shortness of breath but states she was sitting down when her vision began to dim and she felt lightheaded and got mildly sweaty.  Patient believes she lost consciousness.  No weakness numbness of any arm or leg no confusion slurred speech or headache.  Physical Exam   Triage Vital Signs: ED Triage Vitals  Enc Vitals Group     BP 02/22/22 1825 (!) 110/44     Pulse Rate 02/22/22 1825 67     Resp 02/22/22 1825 18     Temp 02/22/22 1825 97.9 F (36.6 C)     Temp Source 02/22/22 1825 Oral     SpO2 02/22/22 1825 98 %     Weight 02/22/22 1831 175 lb (79.4 kg)     Height 02/22/22 1831 5\' 4"  (1.626 m)     Head Circumference --      Peak Flow --      Pain Score --      Pain Loc --      Pain Edu? --      Excl. in GC? --     Most recent vital signs: Vitals:   02/22/22 1825 02/22/22 2142  BP: (!) 110/44 (!) 120/53  Pulse: 67 (!) 58  Resp: 18 18  Temp: 97.9 F (36.6 C)   SpO2: 98% 100%    General: Awake, no distress.  CV:  Good peripheral perfusion.  Regular rate and rhythm  Resp:  Normal effort.  Equal breath sounds bilaterally.  Abd:  No distention.  Soft, nontender.  No rebound or guarding.   ED Results / Procedures / Treatments   EKG  EKG  viewed and interpreted by myself shows a normal sinus rhythm at 69 bpm with a narrow QRS, left axis deviation, largely normal intervals with nonspecific ST changes.  RADIOLOGY  I have reviewed and interpreted the chest x-ray images no significant abnormality seen on my evaluation. Radiology is read the chest x-ray as likely atelectasis. CT scan of the head was negative  MEDICATIONS ORDERED IN ED: Medications  sodium chloride 0.9 % bolus 1,000 mL (1,000 mLs Intravenous New Bag/Given 02/22/22 2154)     IMPRESSION / MDM / ASSESSMENT AND PLAN / ED COURSE  I reviewed the triage vital signs and the nursing notes.  Patient's presentation is most consistent with acute presentation with potential threat to life or bodily function.  Patient presents emergency department after what appears to be a syncopal episode at home.  Patient states she felt like her vision was dimming and then got lightheaded and slightly diaphoretic.  Patient believes she completely lost consciousness for short time.  Here the patient is awake alert oriented she has no complaints.  Patient CT scan of the head is reassuring, chest x-ray  is clear.  EKG shows no concerning findings.  Lab work patient's troponin is negative x2, chemistry does show renal insufficiency creatinine normally around 0.7 0.8 currently 1.4.  We will IV hydrate.  CBC is reassuring.  I spoke to the patient, given her reassuring work-up I believe the patient would be safe for discharge home after IV hydration.  I spoke to the patient regarding increasing her fluid intake at home and following up with her doctor in 1 to 2 days for recheck and recheck for lab work.  Also discussed cardiology follow-up for consideration of a Holter monitor.  Patient agreeable to plan of care.  Patient's repeat troponin remains negative.  Urinalysis is normal with no signs of infection and no ketones.  CT scan of the head is negative.  Discussed with the patient increasing fluids  following up with her doctor as well as cardiology for consideration of a Holter monitor.  Patient agreeable to plan of care.  FINAL CLINICAL IMPRESSION(S) / ED DIAGNOSES   Dehydration Syncope    Note:  This document was prepared using Dragon voice recognition software and may include unintentional dictation errors.   Minna Antis, MD 02/22/22 [id number]

## 2022-03-30 ENCOUNTER — Ambulatory Visit
Admission: RE | Admit: 2022-03-30 | Discharge: 2022-03-30 | Disposition: A | Discharge status: Home / Self Care | Payer: Medicare Other | Source: Ambulatory Visit | Attending: Internal Medicine | Admitting: Internal Medicine

## 2022-03-30 DIAGNOSIS — Z1231 Encounter for screening mammogram for malignant neoplasm of breast: Secondary | ICD-10-CM | POA: Diagnosis present

## 2022-08-10 HISTORY — PX: SCLEROTHERAPY: SHX6841

## 2023-04-05 ENCOUNTER — Encounter: Payer: Self-pay | Admitting: *Deleted

## 2023-04-06 ENCOUNTER — Encounter
Admission: RE | Disposition: A | Discharge status: Home / Self Care | Payer: Self-pay | Source: Home / Self Care | Attending: Gastroenterology

## 2023-04-06 ENCOUNTER — Ambulatory Visit
Admission: RE | Admit: 2023-04-06 | Discharge: 2023-04-06 | Disposition: A | Discharge status: Home / Self Care | Payer: 59 | Attending: Gastroenterology | Admitting: Gastroenterology

## 2023-04-06 ENCOUNTER — Ambulatory Visit: Payer: 59 | Admitting: Certified Registered"

## 2023-04-06 ENCOUNTER — Encounter: Payer: Self-pay | Admitting: *Deleted

## 2023-04-06 DIAGNOSIS — K219 Gastro-esophageal reflux disease without esophagitis: Secondary | ICD-10-CM | POA: Insufficient documentation

## 2023-04-06 DIAGNOSIS — I10 Essential (primary) hypertension: Secondary | ICD-10-CM | POA: Diagnosis not present

## 2023-04-06 DIAGNOSIS — K229 Disease of esophagus, unspecified: Secondary | ICD-10-CM | POA: Diagnosis not present

## 2023-04-06 DIAGNOSIS — R1310 Dysphagia, unspecified: Secondary | ICD-10-CM | POA: Diagnosis present

## 2023-04-06 DIAGNOSIS — K449 Diaphragmatic hernia without obstruction or gangrene: Secondary | ICD-10-CM | POA: Insufficient documentation

## 2023-04-06 HISTORY — PX: ESOPHAGOGASTRODUODENOSCOPY (EGD) WITH PROPOFOL: SHX5813

## 2023-04-06 HISTORY — DX: Pure hypercholesterolemia, unspecified: E78.00

## 2023-04-06 HISTORY — PX: BIOPSY: SHX5522

## 2023-04-06 SURGERY — ESOPHAGOGASTRODUODENOSCOPY (EGD) WITH PROPOFOL
Anesthesia: General

## 2023-04-06 MED ORDER — SODIUM CHLORIDE 0.9 % IV SOLN
INTRAVENOUS | Status: DC
Start: 2023-04-06 — End: 2023-04-06

## 2023-04-06 MED ORDER — LIDOCAINE HCL (CARDIAC) PF 100 MG/5ML IV SOSY
PREFILLED_SYRINGE | INTRAVENOUS | Status: DC | PRN
  Administered 2023-04-06 (×2): 100 mg via INTRAVENOUS

## 2023-04-06 MED ORDER — PROPOFOL 10 MG/ML IV BOLUS
INTRAVENOUS | Status: DC | PRN
  Administered 2023-04-06: 50 mg via INTRAVENOUS

## 2023-04-06 NOTE — H&P (Signed)
Outpatient short stay form Pre-procedure 04/06/2023  Regis Bill, MD  Primary Physician: Lauro Regulus, MD  Reason for visit:  Dysphagia  History of present illness:    82 y/o lady with history of hypertension and HLD here for EGD for chronic dysphagia. Mother had cancer of unknown type. Patient with history of hysterectomy. No blood thinners. The dysphagia is to solids and has been going on for years.    Current Facility-Administered Medications:    0.9 %  sodium chloride infusion, , Intravenous, Continuous, Karigan Cloninger, Rossie Muskrat, MD, Last Rate: 20 mL/hr at 04/06/23 1024, New Bag at 04/06/23 1024  Medications Prior to Admission  Medication Sig Dispense Refill Last Dose   losartan-hydrochlorothiazide (HYZAAR) 100-25 MG tablet Take 1 tablet by mouth daily.   04/05/2023   alendronate (FOSAMAX) 35 MG tablet Take 35 mg by mouth once a week. Take 1 tablet (35 mg total) by mouth every 7 (seven) days Take with a full glass of water. Do not lie down for the next 30 min.      aspirin EC 81 MG tablet Take 81 mg by mouth daily. (Patient not taking: Reported on 04/06/2023)   Not Taking   atorvastatin (LIPITOR) 40 MG tablet Take 40 mg by mouth daily.      Cholecalciferol 50 MCG (2000 UT) TABS Take 2,000 Units by mouth daily.      folic acid (FOLVITE) 1 MG tablet Take 1 mg by mouth daily.      gabapentin (NEURONTIN) 300 MG capsule Take 300 mg by mouth daily.        Allergies  Allergen Reactions   Penicillin G Anaphylaxis     Past Medical History:  Diagnosis Date   Hypercholesteremia    Hypertension     Review of systems:  Otherwise negative.    Physical Exam  Gen: Alert, oriented. Appears stated age.  HEENT: PERRLA. Lungs: No respiratory distress CV: RRR Abd: soft, benign, no masses Ext: No edema    Planned procedures: Proceed with EGD. The patient understands the nature of the planned procedure, indications, risks, alternatives and potential complications including  but not limited to bleeding, infection, perforation, damage to internal organs and possible oversedation/side effects from anesthesia. The patient agrees and gives consent to proceed.  Please refer to procedure notes for findings, recommendations and patient disposition/instructions.     Regis Bill, MD [facility]

## 2023-04-06 NOTE — Transfer of Care (Signed)
Immediate Anesthesia Transfer of Care Note  Patient: Anna Campbell  Procedure(s) Performed: ESOPHAGOGASTRODUODENOSCOPY (EGD) WITH PROPOFOL BIOPSY  Patient Location: PACU  Anesthesia Type:General  Level of Consciousness: awake and alert   Airway & Oxygen Therapy: Patient Spontanous Breathing  Post-op Assessment: Report given to RN and Post -op Vital signs reviewed and stable  Post vital signs: stable  Last Vitals:  Vitals Value Taken Time  BP 126/71 04/06/23 1053  Temp    Pulse 71 04/06/23 1054  Resp 13 04/06/23 1054  SpO2 100 % 04/06/23 1054  Vitals shown include unfiled device data.  Last Pain:  Vitals:   04/06/23 0943  TempSrc: Temporal  PainSc: 0-No pain         Complications: No notable events documented.

## 2023-04-06 NOTE — Anesthesia Preprocedure Evaluation (Signed)
Anesthesia Evaluation  Patient identified by MRN, date of birth, ID band Patient awake    Reviewed: Allergy & Precautions, H&P , NPO status , Patient's Chart, lab work & pertinent test results, reviewed documented beta blocker date and time   History of Anesthesia Complications Negative for: history of anesthetic complications  Airway Mallampati: II  TM Distance: >3 FB Neck ROM: full    Dental  (+) Dental Advidsory Given, Partial Upper, Poor Dentition, Missing   Pulmonary neg pulmonary ROS, Continuous Positive Airway Pressure Ventilation    Pulmonary exam normal breath sounds clear to auscultation       Cardiovascular Exercise Tolerance: Good hypertension, (-) angina (-) Past MI and (-) Cardiac Stents Normal cardiovascular exam(-) dysrhythmias (-) Valvular Problems/Murmurs Rhythm:regular Rate:Normal     Neuro/Psych negative neurological ROS  negative psych ROS   GI/Hepatic Neg liver ROS,GERD  ,,  Endo/Other  negative endocrine ROS    Renal/GU negative Renal ROS  negative genitourinary   Musculoskeletal   Abdominal   Peds  Hematology  (+) Blood dyscrasia, anemia   Anesthesia Other Findings Past Medical History: No date: Hypercholesteremia No date: Hypertension   Reproductive/Obstetrics negative OB ROS                             Anesthesia Physical Anesthesia Plan  ASA: 2  Anesthesia Plan: General   Post-op Pain Management:    Induction: Intravenous  PONV Risk Score and Plan: 3 and Propofol infusion and TIVA  Airway Management Planned: Natural Airway and Nasal Cannula  Additional Equipment:   Intra-op Plan:   Post-operative Plan:   Informed Consent: I have reviewed the patients History and Physical, chart, labs and discussed the procedure including the risks, benefits and alternatives for the proposed anesthesia with the patient or authorized representative who has  indicated his/her understanding and acceptance.     Dental Advisory Given  Plan Discussed with: Anesthesiologist, CRNA and Surgeon  Anesthesia Plan Comments:         Anesthesia Quick Evaluation

## 2023-04-06 NOTE — Op Note (Signed)
[facility] Gastroenterology Patient Name: Anna Campbell Procedure Date: 04/06/2023 10:15 AM MRN: [id number] Account #: [id number] Date of Birth: 1940-10-29 Admit Type: Outpatient Age: 82 Room: [facility] ENDO ROOM 1 Gender: Female Note Status: Finalized Instrument Name: Patton Salles Endoscope [id number] Procedure:             Upper GI endoscopy Indications:           Dysphagia Providers:             Eather Colas MD, MD Referring MD:          Eather Colas MD, MD (Referring MD), Marya Amsler.                         Dareen Piano MD, MD (Referring MD) Medicines:             Monitored Anesthesia Care Complications:         No immediate complications. Procedure:             Pre-Anesthesia Assessment:                        - Prior to the procedure, a History and Physical was                         performed, and patient medications and allergies were                         reviewed. The patient is competent. The risks and                         benefits of the procedure and the sedation options and                         risks were discussed with the patient. All questions                         were answered and informed consent was obtained.                         Patient identification and proposed procedure were                         verified by the physician, the nurse, the                         anesthesiologist, the anesthetist and the technician                         in the endoscopy suite. Mental Status Examination:                         alert and oriented. Airway Examination: normal                         oropharyngeal airway and neck mobility. Respiratory                         Examination: clear to auscultation. CV Examination:  normal. Prophylactic Antibiotics: The patient does not                         require prophylactic antibiotics. Prior                         Anticoagulants: The patient has taken no anticoagulant                          or antiplatelet agents. ASA Grade Assessment: II - A                         patient with mild systemic disease. After reviewing                         the risks and benefits, the patient was deemed in                         satisfactory condition to undergo the procedure. The                         anesthesia plan was to use monitored anesthesia care                         (MAC). Immediately prior to administration of                         medications, the patient was re-assessed for adequacy                         to receive sedatives. The heart rate, respiratory                         rate, oxygen saturations, blood pressure, adequacy of                         pulmonary ventilation, and response to care were                         monitored throughout the procedure. The physical                         status of the patient was re-assessed after the                         procedure.                        After obtaining informed consent, the endoscope was                         passed under direct vision. Throughout the procedure,                         the patient's blood pressure, pulse, and oxygen                         saturations were monitored continuously. The Endoscope  was introduced through the mouth, and advanced to the                         second part of duodenum. The upper GI endoscopy was                         accomplished without difficulty. The patient tolerated                         the procedure well. Findings:      A small hiatal hernia was present.      The exam of the esophagus was otherwise normal.      The entire examined stomach was normal.      The examined duodenum was normal. Impression:            - Small hiatal hernia.                        - Normal stomach.                        - Normal examined duodenum.                        - No specimens collected. Recommendation:        - Discharge  patient to home.                        - Resume previous diet.                        - Continue present medications.                        - Await pathology results.                        - Perform a barium swallow using barium in liquid and                         tablet form at appointment to be scheduled.                        - Return to referring physician as previously                         scheduled. Procedure Code(s):     --- Professional ---                        [phone], Esophagogastroduodenoscopy, flexible,                         transoral; diagnostic, including collection of                         specimen(s) by brushing or washing, when performed                         (separate procedure) Diagnosis Code(s):     --- Professional ---  K44.9, Diaphragmatic hernia without obstruction or                         gangrene                        R13.10, Dysphagia, unspecified CPT copyright 2022 American Medical Association. All rights reserved. The codes documented in this report are preliminary and upon coder review may  be revised to meet current compliance requirements. Eather Colas MD, MD 04/06/2023 10:54:53 AM Number of Addenda: 0 Note Initiated On: 04/06/2023 10:15 AM Estimated Blood Loss:  Estimated blood loss was minimal.      [facility]

## 2023-04-06 NOTE — Interval H&P Note (Signed)
History and Physical Interval Note:  04/06/2023 10:35 AM  Anna Campbell  has presented today for surgery, with the diagnosis of dysphagia.  The various methods of treatment have been discussed with the patient and family. After consideration of risks, benefits and other options for treatment, the patient has consented to  Procedure(s) with comments: ESOPHAGOGASTRODUODENOSCOPY (EGD) WITH PROPOFOL (N/A) - SPANISH INTERPRETER as a surgical intervention.  The patient's history has been reviewed, patient examined, no change in status, stable for surgery.  I have reviewed the patient's chart and labs.  Questions were answered to the patient's satisfaction.     Regis Bill  Ok to proceed with EGD

## 2023-04-07 ENCOUNTER — Encounter: Payer: Self-pay | Admitting: Gastroenterology

## 2023-04-07 NOTE — Anesthesia Postprocedure Evaluation (Signed)
Anesthesia Post Note  Patient: Anna Campbell  Procedure(s) Performed: ESOPHAGOGASTRODUODENOSCOPY (EGD) WITH PROPOFOL BIOPSY  Patient location during evaluation: Endoscopy Anesthesia Type: General Level of consciousness: awake and alert Pain management: pain level controlled Vital Signs Assessment: post-procedure vital signs reviewed and stable Respiratory status: spontaneous breathing, nonlabored ventilation, respiratory function stable and patient connected to nasal cannula oxygen Cardiovascular status: blood pressure returned to baseline and stable Postop Assessment: no apparent nausea or vomiting Anesthetic complications: no   There were no known notable events for this encounter.   Last Vitals:  Vitals:   04/06/23 1053 04/06/23 1103  BP: 126/71 (!) 174/83  Pulse: 69   Resp: 15   Temp: (!) 36.3 C   SpO2: 100%     Last Pain:  Vitals:   04/06/23 1103  TempSrc:   PainSc: 0-No pain                 Lenard Simmer

## 2023-09-30 ENCOUNTER — Emergency Department
Admission: EM | Admit: 2023-09-30 | Discharge: 2023-09-30 | Disposition: A | Discharge status: Home / Self Care | Payer: 59 | Attending: Emergency Medicine | Admitting: Emergency Medicine

## 2023-09-30 ENCOUNTER — Emergency Department: Payer: 59

## 2023-09-30 ENCOUNTER — Other Ambulatory Visit: Payer: Self-pay

## 2023-09-30 DIAGNOSIS — I10 Essential (primary) hypertension: Secondary | ICD-10-CM | POA: Insufficient documentation

## 2023-09-30 DIAGNOSIS — S52511A Displaced fracture of right radial styloid process, initial encounter for closed fracture: Secondary | ICD-10-CM | POA: Insufficient documentation

## 2023-09-30 DIAGNOSIS — W19XXXA Unspecified fall, initial encounter: Secondary | ICD-10-CM | POA: Diagnosis not present

## 2023-09-30 DIAGNOSIS — S52501A Unspecified fracture of the lower end of right radius, initial encounter for closed fracture: Secondary | ICD-10-CM | POA: Insufficient documentation

## 2023-09-30 DIAGNOSIS — S62101A Fracture of unspecified carpal bone, right wrist, initial encounter for closed fracture: Secondary | ICD-10-CM

## 2023-09-30 DIAGNOSIS — M25531 Pain in right wrist: Secondary | ICD-10-CM | POA: Diagnosis present

## 2023-09-30 MED ORDER — LIDOCAINE HCL (PF) 1 % IJ SOLN
5.0000 mL | Freq: Once | INTRAMUSCULAR | Status: AC
  Administered 2023-09-30: 5 mL
  Filled 2023-09-30: qty 5

## 2023-09-30 MED ORDER — HYDROCODONE-ACETAMINOPHEN 5-325 MG PO TABS
1.0000 | ORAL_TABLET | Freq: Three times a day (TID) | ORAL | 0 refills | Status: AC | PRN
  Filled 2023-09-30: qty 9, 3d supply, fill #0

## 2023-09-30 MED ORDER — HYDROCODONE-ACETAMINOPHEN 5-325 MG PO TABS
1.0000 | ORAL_TABLET | Freq: Once | ORAL | Status: AC
  Administered 2023-09-30: 1 via ORAL
  Filled 2023-09-30: qty 1

## 2023-09-30 NOTE — Discharge Instructions (Addendum)
You are being treated for a wrist fracture.  Your wrist has been splinted for support.  You should follow-up with [facility] orthopedics as discussed.  Dr. Allena Katz can see you next week to discuss further fracture care options.  Take the narcotic pain medicine as needed.  Take OTC Tylenol for nondrowsy pain relief.  Apply ice packs over the splint to help reduce swelling.  Keep the splint clean and dry.  Est siendo tratado por una fractura de [place].  Le han entablillado la mueca para darle apoyo.  Debe realizar un seguimiento con la ortopedia de [facility] se Actor.  El Dr. Allena Katz podr verlo la prxima semana para analizar ms opciones de atencin de fracturas.  Tome el analgsico narctico segn sea necesario.  Tome Tylenol de venta libre para Engineer, materials sin producir somnolencia.  Aplique compresas de hielo sobre la frula para ayudar a Building services engineer.  Mantenga la frula limpia y seca.

## 2023-09-30 NOTE — ED Triage Notes (Signed)
First nurse note: pt to ED [facility] from home for mechanical fall. Right wrist injury. HTN with hx same, has not taken meds.

## 2023-10-01 NOTE — ED Provider Notes (Signed)
 [facility] Emergency Department Provider Note     Event Date/Time   First MD Initiated Contact with Patient 09/30/23 1142     (approximate)   History   Fall   HPI  Anna Campbell is a 83 y.o. female with a history of HTN and HLD, presents to the ED for evaluation of a mechanical fall.  She presents from home via EMS endorsing right wrist pain and disability.  Denies any head injury or LOC.  No other injury reported at this time.     Physical Exam   Triage Vital Signs: ED Triage Vitals [09/30/23 1049]  Encounter Vitals Group     BP (!) 172/88     Systolic BP Percentile      Diastolic BP Percentile      Pulse Rate 81     Resp 17     Temp 98 F (36.7 C)     Temp src      SpO2 100 %     Weight 170 lb (77.1 kg)     Height 5\' 4"  (1.626 m)     Head Circumference      Peak Flow      Pain Score 4     Pain Loc      Pain Education      Exclude from Growth Chart     Most recent vital signs: Vitals:   09/30/23 1049  BP: (!) 172/88  Pulse: 81  Resp: 17  Temp: 98 F (36.7 C)  SpO2: 100%    General Awake, no distress. NAD HEENT NCAT. PERRL. EOMI. No rhinorrhea. Mucous membranes are moist.  CV:  Good peripheral perfusion. Normal cap refill RESP:  Normal effort.  MSK:  Right hand and wrist with obvious deformity noted to the distal wrist.  Normal composite fist distally.   ED Results / Procedures / Treatments   Labs (all labs ordered are listed, but only abnormal results are displayed) Labs Reviewed - No data to display   EKG   RADIOLOGY  I personally viewed and evaluated these images as part of my medical decision making, as well as reviewing the written report by the radiologist.  ED Provider Interpretation: Acute impacted comminuted right wrist radial fracture with ulnar styloid fracture  DG Right Wrist  IMPRESSION: 1. Acute impacted, angulated, and displaced fracture of the distal radial metaphysis. Fracture margins  extend to the distal radioulnar. No definite evidence of intra-articular extension into the radiocarpal joint is identified. 2. Acute mildly displaced fracture at the base of the ulnar styloid.  DG Right Wrist (2V) S/P reduction/splint  IMPRESSION: Status post reduction and casting of the distal radius and ulnar styloid fractures.  PROCEDURES:  Critical Care performed: No  .Reduction of fracture  Date/Time: 09/30/2023 4:38 PM  Performed by: Lissa Hoard, PA-C Authorized by: Lissa Hoard, PA-C  Consent: Verbal consent obtained. Written consent not obtained. Risks and benefits: risks, benefits and alternatives were discussed Consent given by: patient Patient understanding: patient states understanding of the procedure being performed Site marked: the operative site was marked Imaging studies: imaging studies available Required items: required blood products, implants, devices, and special equipment available Patient identity confirmed: verbally with patient Local anesthesia used: yes Anesthesia: hematoma block  Anesthesia: Local anesthesia used: yes Local Anesthetic: lidocaine 1% without epinephrine Anesthetic total: 4 mL  Sedation: Patient sedated: no  Patient tolerance: patient tolerated the procedure well with no immediate complications Comments: Manual reduction of the  fracture of the right radial wrist using finger traps and countertraction.   Marland KitchenSplint Application  Date/Time: 09/30/2023 4:39 PM  Performed by: Lissa Hoard, PA-C Authorized by: Lissa Hoard, PA-C   Consent:    Consent obtained:  Verbal   Consent given by:  Patient   Risks, benefits, and alternatives were discussed: yes     Risks discussed:  Pain Universal protocol:    Test results available: yes     Imaging studies available: yes     Site/side marked: yes     Patient identity confirmed:  Verbally with patient Pre-procedure details:    Distal  neurologic exam:  Normal   Distal perfusion: distal pulses strong   Procedure details:    Location:  Wrist   Wrist location:  R wrist   Splint type:  Sugar tong   Supplies:  Elastic bandage, fiberglass, cotton padding and sling   Attestation: Splint applied and adjusted personally by me   Post-procedure details:    Distal neurologic exam:  Normal   Distal perfusion: distal pulses strong     Procedure completion:  Tolerated well, no immediate complications   Post-procedure imaging: reviewed      MEDICATIONS ORDERED IN ED: Medications  HYDROcodone-acetaminophen (NORCO/VICODIN) 5-325 MG per tablet 1 tablet (1 tablet Oral Given 09/30/23 1326)  lidocaine (PF) (XYLOCAINE) 1 % injection 5 mL (5 mLs Infiltration Given 09/30/23 1502)     IMPRESSION / MDM / ASSESSMENT AND PLAN / ED COURSE  I reviewed the triage vital signs and the nursing notes.                              Differential diagnosis includes, but is not limited to, wrist fracture, wrist dislocation, wrist sprain, abrasion, hematoma  Patient's presentation is most consistent with acute complicated illness / injury requiring diagnostic workup.  Patient's diagnosis is consistent with close right wrist fracture status post mechanical fall.  Geriatric patient presents to the ED for evaluation of right wrist deformity and disability.  X-rays reviewed by me, revealed comminuted impacted right radial wrist fracture with ulnar styloid fracture.  I discussed the case via secure chat with Dr. Allena Katz, who agrees with the plan for closed reduction and splinting.  Patient will be appropriate for outpatient management.  Close reduction is performed, patient is placed in a sugar-tong splint and sling for comfort.  Patient will be discharged home with prescriptions for hydrocodone. Patient is to follow up with [facility] Orth[facility] as discussed, as needed or otherwise directed. Patient is given ED precautions to return to the ED for any worsening or new  symptoms.     FINAL CLINICAL IMPRESSION(S) / ED DIAGNOSES   Final diagnoses:  Fall, initial encounter  Closed fracture of right wrist, initial encounter     Rx / DC Orders   ED Discharge Orders          Ordered    HYDROcodone-acetaminophen (NORCO/VICODIN) 5-325 MG tablet  3 times daily PRN        09/30/23 1434             Note:  This document was prepared using Dragon voice recognition software and may include unintentional dictation errors.    Lissa Hoard, PA-C 10/01/23 2141    Delton Pra[place], MD 10/04/23 [phone]

## 2023-10-11 ENCOUNTER — Encounter: Payer: Self-pay | Admitting: Orthopedic Surgery

## 2023-10-11 ENCOUNTER — Other Ambulatory Visit: Payer: Self-pay | Admitting: Orthopedic Surgery

## 2023-10-11 NOTE — Anesthesia Preprocedure Evaluation (Addendum)
 Anesthesia Evaluation  Patient identified by MRN, date of birth, ID band Patient awake    Reviewed: Allergy & Precautions, H&P , NPO status , Patient's Chart, lab work & pertinent test results  Airway Mallampati: II  TM Distance: >3 FB Neck ROM: Full    Dental no notable dental hx. (+) Partial Upper, Missing, Poor Dentition   Pulmonary neg pulmonary ROS   Pulmonary exam normal breath sounds clear to auscultation       Cardiovascular hypertension, + Peripheral Vascular Disease  Normal cardiovascular exam Rhythm:Regular Rate:Normal  Epic says PVD and aortic atherosclerosis, physician note reports normal ABI, cannot find echo nor heart cath, nor other documentation of PVD nor of atherosclerosis   Neuro/Psych negative neurological ROS  negative psych ROS   GI/Hepatic negative GI ROS, Neg liver ROS,,,  Endo/Other  negative endocrine ROS    Renal/GU Renal diseasenegative Renal ROS  negative genitourinary   Musculoskeletal negative musculoskeletal ROS (+)    Abdominal   Peds negative pediatric ROS (+)  Hematology negative hematology ROS (+) Blood dyscrasia, anemia   Anesthesia Other Findings Hypertension  Hypercholesteremia PVD (peripheral vascular disease) (HCC)  Aortic atherosclerosis (HCC) Osteoporosis  Wears dentures Patient requests her niece to be translator   Reproductive/Obstetrics negative OB ROS                             Anesthesia Physical Anesthesia Plan  ASA: 3  Anesthesia Plan: General   Post-op Pain Management:    Induction: Intravenous  PONV Risk Score and Plan:   Airway Management Planned: LMA  Additional Equipment:   Intra-op Plan:   Post-operative Plan: Extubation in OR  Informed Consent: I have reviewed the patients History and Physical, chart, labs and discussed the procedure including the risks, benefits and alternatives for the proposed anesthesia with  the patient or authorized representative who has indicated his/her understanding and acceptance.     Dental Advisory Given  Plan Discussed with: Anesthesiologist, CRNA and Surgeon  Anesthesia Plan Comments: (Patient consented for risks of anesthesia including but not limited to:  - adverse reactions to medications - damage to eyes, teeth, lips or other oral mucosa - nerve damage due to positioning  - sore throat or hoarseness - Damage to heart, brain, nerves, lungs, other parts of body or loss of life  Patient voiced understanding and assent.)        Anesthesia Quick Evaluation

## 2023-10-12 ENCOUNTER — Ambulatory Visit: Payer: Self-pay

## 2023-10-12 ENCOUNTER — Ambulatory Visit: Admitting: Anesthesiology

## 2023-10-12 ENCOUNTER — Ambulatory Visit
Admission: RE | Admit: 2023-10-12 | Discharge: 2023-10-12 | Disposition: A | Discharge status: Home / Self Care | Attending: Orthopedic Surgery | Admitting: Orthopedic Surgery

## 2023-10-12 ENCOUNTER — Encounter
Admission: RE | Disposition: A | Discharge status: Home / Self Care | Payer: Self-pay | Source: Home / Self Care | Attending: Orthopedic Surgery

## 2023-10-12 ENCOUNTER — Other Ambulatory Visit: Payer: Self-pay

## 2023-10-12 ENCOUNTER — Encounter: Payer: Self-pay | Admitting: Orthopedic Surgery

## 2023-10-12 DIAGNOSIS — W19XXXA Unspecified fall, initial encounter: Secondary | ICD-10-CM | POA: Diagnosis not present

## 2023-10-12 DIAGNOSIS — Z79899 Other long term (current) drug therapy: Secondary | ICD-10-CM | POA: Insufficient documentation

## 2023-10-12 DIAGNOSIS — R55 Syncope and collapse: Secondary | ICD-10-CM

## 2023-10-12 DIAGNOSIS — D649 Anemia, unspecified: Secondary | ICD-10-CM | POA: Insufficient documentation

## 2023-10-12 DIAGNOSIS — I739 Peripheral vascular disease, unspecified: Secondary | ICD-10-CM | POA: Insufficient documentation

## 2023-10-12 DIAGNOSIS — S52531A Colles' fracture of right radius, initial encounter for closed fracture: Secondary | ICD-10-CM | POA: Insufficient documentation

## 2023-10-12 DIAGNOSIS — Z791 Long term (current) use of non-steroidal anti-inflammatories (NSAID): Secondary | ICD-10-CM | POA: Insufficient documentation

## 2023-10-12 DIAGNOSIS — I10 Essential (primary) hypertension: Secondary | ICD-10-CM | POA: Diagnosis not present

## 2023-10-12 DIAGNOSIS — I700 Atherosclerosis of aorta: Secondary | ICD-10-CM

## 2023-10-12 DIAGNOSIS — Y9301 Activity, walking, marching and hiking: Secondary | ICD-10-CM | POA: Diagnosis not present

## 2023-10-12 HISTORY — PX: OPEN REDUCTION INTERNAL FIXATION (ORIF) DISTAL RADIAL FRACTURE: SHX5989

## 2023-10-12 HISTORY — DX: Peripheral vascular disease, unspecified: I73.9

## 2023-10-12 HISTORY — DX: Age-related osteoporosis without current pathological fracture: M81.0

## 2023-10-12 HISTORY — DX: Atherosclerosis of aorta: I70.0

## 2023-10-12 HISTORY — DX: Presence of dental prosthetic device (complete) (partial): Z97.2

## 2023-10-12 SURGERY — OPEN REDUCTION INTERNAL FIXATION (ORIF) DISTAL RADIUS FRACTURE
Anesthesia: General | Site: Wrist | Laterality: Right

## 2023-10-12 MED ORDER — PROPOFOL 10 MG/ML IV BOLUS
INTRAVENOUS | Status: AC
  Filled 2023-10-12: qty 20

## 2023-10-12 MED ORDER — ROPIVACAINE HCL 5 MG/ML IJ SOLN
INTRAMUSCULAR | Status: DC | PRN
  Administered 2023-10-12: 25 mL via PERINEURAL

## 2023-10-12 MED ORDER — PHENYLEPHRINE HCL (PRESSORS) 10 MG/ML IV SOLN
INTRAVENOUS | Status: DC | PRN
  Administered 2023-10-12: 160 ug via INTRAVENOUS
  Administered 2023-10-12 (×2): 80 ug via INTRAVENOUS
  Administered 2023-10-12 (×2): 160 ug via INTRAVENOUS

## 2023-10-12 MED ORDER — EPHEDRINE SULFATE (PRESSORS) 50 MG/ML IJ SOLN
INTRAMUSCULAR | Status: DC | PRN
  Administered 2023-10-12: 7.5 mg via INTRAVENOUS
  Administered 2023-10-12: 10 mg via INTRAVENOUS

## 2023-10-12 MED ORDER — PHENYLEPHRINE 80 MCG/ML (10ML) SYRINGE FOR IV PUSH (FOR BLOOD PRESSURE SUPPORT)
PREFILLED_SYRINGE | INTRAVENOUS | Status: AC
  Filled 2023-10-12: qty 10

## 2023-10-12 MED ORDER — DEXAMETHASONE SODIUM PHOSPHATE 10 MG/ML IJ SOLN
INTRAMUSCULAR | Status: DC | PRN
  Administered 2023-10-12: 10 mg

## 2023-10-12 MED ORDER — MIDAZOLAM HCL 2 MG/2ML IJ SOLN
INTRAMUSCULAR | Status: AC
  Filled 2023-10-12: qty 2

## 2023-10-12 MED ORDER — PROPOFOL 10 MG/ML IV BOLUS
INTRAVENOUS | Status: DC | PRN
  Administered 2023-10-12: 150 mg via INTRAVENOUS

## 2023-10-12 MED ORDER — LIDOCAINE-EPINEPHRINE 1 %-1:100000 IJ SOLN
INTRAMUSCULAR | Status: DC | PRN
Start: 2023-10-12 — End: 2023-10-12
  Administered 2023-10-12: 10 mL via INTRADERMAL

## 2023-10-12 MED ORDER — DEXAMETHASONE SODIUM PHOSPHATE 10 MG/ML IJ SOLN
INTRAMUSCULAR | Status: AC
  Filled 2023-10-12: qty 1

## 2023-10-12 MED ORDER — MIDAZOLAM HCL 2 MG/2ML IJ SOLN
2.0000 mg | Freq: Once | INTRAMUSCULAR | Status: AC
  Administered 2023-10-12: 2 mg via INTRAVENOUS

## 2023-10-12 MED ORDER — 0.9 % SODIUM CHLORIDE (POUR BTL) OPTIME
TOPICAL | Status: DC | PRN
  Administered 2023-10-12: 200 mL

## 2023-10-12 MED ORDER — LIDOCAINE-EPINEPHRINE 1 %-1:100000 IJ SOLN
INTRAMUSCULAR | Status: AC
  Filled 2023-10-12: qty 1

## 2023-10-12 MED ORDER — LIDOCAINE HCL (PF) 2 % IJ SOLN
INTRAMUSCULAR | Status: AC
  Filled 2023-10-12: qty 5

## 2023-10-12 MED ORDER — HYDROCODONE-ACETAMINOPHEN 5-325 MG PO TABS: 1.0000 | ORAL_TABLET | Freq: Four times a day (QID) | ORAL | 0 refills | Status: AC | PRN

## 2023-10-12 MED ORDER — LIDOCAINE HCL (CARDIAC) PF 100 MG/5ML IV SOSY
PREFILLED_SYRINGE | INTRAVENOUS | Status: DC | PRN
  Administered 2023-10-12: 100 mg via INTRATRACHEAL

## 2023-10-12 MED ORDER — VANCOMYCIN HCL 500 MG IV SOLR
INTRAVENOUS | Status: AC
  Filled 2023-10-12: qty 20

## 2023-10-12 MED ORDER — VANCOMYCIN HCL IN DEXTROSE 1-5 GM/200ML-% IV SOLN
1000.0000 mg | Freq: Once | INTRAVENOUS | Status: AC
  Administered 2023-10-12: 1000 mg via INTRAVENOUS

## 2023-10-12 MED ORDER — LACTATED RINGERS IV SOLN: INTRAVENOUS | Status: DC

## 2023-10-12 MED ORDER — FENTANYL CITRATE (PF) 100 MCG/2ML IJ SOLN: 100.0000 ug | Freq: Once | INTRAMUSCULAR | Status: DC

## 2023-10-12 MED ORDER — ONDANSETRON HCL 4 MG/2ML IJ SOLN
INTRAMUSCULAR | Status: DC | PRN
Start: 2023-10-12 — End: 2023-10-12
  Administered 2023-10-12: 4 mg via INTRAVENOUS

## 2023-10-12 MED ORDER — FENTANYL CITRATE (PF) 100 MCG/2ML IJ SOLN
INTRAMUSCULAR | Status: AC
  Filled 2023-10-12: qty 2

## 2023-10-12 MED ORDER — ONDANSETRON HCL 4 MG/2ML IJ SOLN
INTRAMUSCULAR | Status: AC
  Filled 2023-10-12: qty 2

## 2023-10-12 MED ORDER — SODIUM CHLORIDE 0.9 % IV SOLN: INTRAVENOUS | Status: DC | PRN

## 2023-10-12 SURGICAL SUPPLY — 30 items
BIT DRILL 2 FAST STEP (BIT) IMPLANT
BIT DRILL 2.5X4 QC (BIT) IMPLANT
BNDG ELASTIC 3X5.8 VLCR NS LF (GAUZE/BANDAGES/DRESSINGS) ×1 IMPLANT
CHLORAPREP W/TINT 26 (MISCELLANEOUS) ×1 IMPLANT
COVER LIGHT HANDLE UNIVERSAL (MISCELLANEOUS) ×2 IMPLANT
DRAPE FLUOR MINI C-ARM 54X84 (DRAPES) ×1 IMPLANT
ELECT REM PT RETURN 9FT ADLT (ELECTROSURGICAL) ×1 IMPLANT
ELECTRODE REM PT RTRN 9FT ADLT (ELECTROSURGICAL) ×1 IMPLANT
GAUZE SPONGE 4X4 12PLY STRL (GAUZE/BANDAGES/DRESSINGS) ×1 IMPLANT
GAUZE XEROFORM 1X8 LF (GAUZE/BANDAGES/DRESSINGS) ×1 IMPLANT
GLOVE SURG SYN 9.0 PF PI (GLOVE) ×1 IMPLANT
GOWN STRL REIN 2XL XLG LVL4 (GOWN DISPOSABLE) ×1 IMPLANT
GOWN STRL REUS W/ TWL LRG LVL3 (GOWN DISPOSABLE) ×1 IMPLANT
K-WIRE FX5X1.6XNS BN SS (WIRE) ×1 IMPLANT
KIT TURNOVER KIT A (KITS) ×1 IMPLANT
KWIRE FX5X1.6XNS BN SS (WIRE) IMPLANT
NS IRRIG 500ML POUR BTL (IV SOLUTION) ×1 IMPLANT
PACK EXTREMITY ARMC (MISCELLANEOUS) ×1 IMPLANT
PADDING CAST BLEND 3X4 STRL (MISCELLANEOUS) ×2 IMPLANT
PEG SUBCHONDRAL SMOOTH 2.0X14 (Peg) IMPLANT
PEG SUBCHONDRAL SMOOTH 2.0X16 (Peg) IMPLANT
PEG SUBCHONDRAL SMOOTH 2.0X20 (Peg) IMPLANT
PLATE SHORT 21.6X48.9 NRRW RT (Plate) IMPLANT
SCREW CORT 3.5X10 LNG (Screw) IMPLANT
SCREW MULTI DIRECT 20MM (Screw) IMPLANT
SCREW MULTI DIRECT 22MM (Screw) IMPLANT
SPLINT CAST 1 STEP 3X12 (MISCELLANEOUS) ×1 IMPLANT
SUT ETHILON 4-0 FS2 18XMFL BLK (SUTURE) ×1 IMPLANT
SUT VICRYL 3-0 27IN (SUTURE) ×1 IMPLANT
SUTURE ETHLN 4-0 FS2 18XMF BLK (SUTURE) ×1 IMPLANT

## 2023-10-12 NOTE — Transfer of Care (Signed)
 Immediate Anesthesia Transfer of Care Note  Patient: Anna Campbell  Procedure(s) Performed: OPEN REDUCTION INTERNAL FIXATION (ORIF) DISTAL RADIUS FRACTURE (Right: Wrist)  Patient Location: PACU  Anesthesia Type: General  Level of Consciousness: awake, alert  and patient cooperative  Airway and Oxygen Therapy: Patient Spontanous Breathing and Patient connected to supplemental oxygen  Post-op Assessment: Post-op Vital signs reviewed, Patient's Cardiovascular Status Stable, Respiratory Function Stable, Patent Airway and No signs of Nausea or vomiting  Post-op Vital Signs: Reviewed and stable  Complications: No notable events documented.

## 2023-10-12 NOTE — Anesthesia Procedure Notes (Signed)
 Anesthesia Regional Block: Supraclavicular block   Pre-Anesthetic Checklist: , timeout performed,  Correct Patient, Correct Site, Correct Laterality,  Correct Procedure, Correct Position, site marked,  Risks and benefits discussed,  Surgical consent,  Pre-op evaluation,  At surgeon's request and post-op pain management  Laterality: Upper and Right  Prep: chloraprep       Needles:  Injection technique: Single-shot  Needle Type: Stimiplex     Needle Length: 9cm  Needle Gauge: 22     Additional Needles:   Procedures:,,,, ultrasound used (permanent image in chart),,    Narrative:  Start time: 10/12/2023 12:45 PM End time: 10/12/2023 12:50 PM Injection made incrementally with aspirations every 5 mL.  Performed by: Personally  Anesthesiologist: Marisue Humble, MD  Additional Notes: Patient consented for risk and benefits of nerve block including but not limited to nerve damage, Horner's syndrome, failed block, bleeding and infection.  Patient voiced understanding.  Functioning IV was confirmed and monitors were applied.  Timeout done prior to procedure and prior to any sedation being given to the patient.  Patient confirmed procedure site prior to any sedation given to the patient.  A 50mm 22ga Stimuplex needle was used. Sterile prep,hand hygiene and sterile gloves were used.  Minimal sedation used for procedure.  No paresthesia endorsed by patient during the procedure.  Negative aspiration and negative test dose prior to incremental administration of local anesthetic. The patient tolerated the procedure well with no immediate complications. Also intercostobrachial block

## 2023-10-12 NOTE — Anesthesia Postprocedure Evaluation (Signed)
 Anesthesia Post Note  Patient: Anna Campbell  Procedure(s) Performed: OPEN REDUCTION INTERNAL FIXATION (ORIF) DISTAL RADIUS FRACTURE (Right: Wrist)  Patient location during evaluation: PACU Anesthesia Type: General Level of consciousness: awake and alert Pain management: pain level controlled Vital Signs Assessment: post-procedure vital signs reviewed and stable Respiratory status: spontaneous breathing, nonlabored ventilation, respiratory function stable and patient connected to nasal cannula oxygen Cardiovascular status: blood pressure returned to baseline and stable Postop Assessment: no apparent nausea or vomiting Anesthetic complications: no   No notable events documented.   Last Vitals:  Vitals:   10/12/23 1500 10/12/23 1511  BP: 128/67 130/60  Pulse: 93 89  Resp: 18 (!) 22  Temp:  36.4 C  SpO2: 95% 95%    Last Pain:  Vitals:   10/12/23 1511  TempSrc:   PainSc: 0-No pain                 Calynn Ferrero C Kaedynce Tapp

## 2023-10-12 NOTE — Op Note (Signed)
 10/12/2023  2:56 PM  PATIENT:  Anna Campbell  83 y.o. female  PRE-OPERATIVE DIAGNOSIS:  Acute pain of right wrist M25.531 Closed Colles' fracture of right radius, initial encounter S52.531A  POST-OPERATIVE DIAGNOSIS:  Acute pain of right wrist, Closed Colles' fracture of right radius, initial encounter  PROCEDURE:  Procedure(s): OPEN REDUCTION INTERNAL FIXATION (ORIF) DISTAL RADIUS FRACTURE (Right)  SURGEON: Leitha Schuller, MD  ASSISTANTS: none  ANESTHESIA:   general  EBL:  Total I/O In: 700 [I.V.:500; IV Piggyback:200] Out: 2 [Blood:2]  BLOOD ADMINISTERED:none  DRAINS: none   LOCAL MEDICATIONS USED:  OTHER preop block by anesthesia  SPECIMEN:  No Specimen  DISPOSITION OF SPECIMEN:  N/A  COUNTS:  YES  TOURNIQUET:   Total Tourniquet Time Documented: Upper Arm (Right) - 25 minutes Total: Upper Arm (Right) - 25 minutes   IMPLANTS: Short narrow DVR with multiple smooth pegs multidirectional screws and cortical screws  DICTATION: .Dragon Dictation patient was brought to the operating room and after the arm was prepped and draped in the usual sterile fashion after a regional block placed by anesthesia before coming back to the OR was obtained.   Tourniquet having been applied to the upper arm.  Appropriate patient identification and timeout procedures were completed.  Tourniquet was raised and a volar approach was made after traction was applied at the end of the table through fingertrap traction.  Approach is center of the FCR tendon.  Tendon sheath incised and the tendon retracted radially protect the radial artery and associated veins.  The deep fascia was incised and the pronator was elevated off the distal and proximal fragments with exposure of the fracture site.  The fracture was extraarticular with multiple interposed tissue  fragments and with traction applied and Freer elevator adequate alignment was obtained.  A short narrow DVR plate applied with distal first technique  using a K wire to hold the plate in position position of the plate was checked in both AP and lateral projections.  When acceptable position was obtained the smooth pegs were placed drilling measuring and placing the smooth pegs in the distal fragments along with 2 variable-angle screws distally.  Following this the shaft portion of the plate was brought down to the bone and 3  cortical screws were sequentially inserted they gave an essentially anatomic alignment in both AP and lateral projections.  Traction was removed and under fluoroscopic views there is no motion of the fracture.   The wound irrigated with tourniquet let down.  Wound was then closed with 3-0 Vicryl for the deep tissue and 4-0 nylon in a simple interrupted manner..  Dressing of 4 x 4's web roll and volar splint followed by Ace wrap applied.   PLAN OF CARE: Discharge to home after PACU  PATIENT DISPOSITION:  PACU - hemodynamically stable.

## 2023-10-12 NOTE — Anesthesia Procedure Notes (Signed)
 Procedure Name: LMA Insertion Date/Time: 10/12/2023 2:02 PM  Performed by: Zamauri Nez, [place], CRNAPre-anesthesia Checklist: Patient identified, Patient being monitored, Timeout performed, Emergency Drugs available and Suction available Patient Re-evaluated:Patient Re-evaluated prior to induction Oxygen Delivery Method: Circle system utilized Preoxygenation: Pre-oxygenation with 100% oxygen Induction Type: IV induction Ventilation: Mask ventilation without difficulty LMA: LMA inserted LMA Size: 4.0 Tube type: Oral Number of attempts: 1 Placement Confirmation: positive ETCO2 and breath sounds checked- equal and bilateral Tube secured with: Tape Dental Injury: Teeth and Oropharynx as per pre-operative assessment

## 2023-10-12 NOTE — Discharge Instructions (Signed)
 Keep arm elevated is much as possible Work on finger motion all you can especially bending at the knuckles Ice to the back of the hand and wrist today and tomorrow Pain medicine as directed Call office if you are having problems [phone]

## 2023-10-12 NOTE — H&P (Signed)
 Chief Complaint Patient presents with Right Wrist - Pain  Subjective  HPI  Anna Campbell is a 83 y.o. female who presents for Pain of the Right Wrist History of Present Illness Anna Campbell is an 83 year old female who presents for follow-up after a wrist fracture.  She sustained a wrist fracture on September 30, 2023, after falling while walking outside her building, possibly due to slipping on ice. She was seen in the emergency room on the same day and underwent a closed reduction of the fracture. The fracture is on her right wrist, which is her dominant hand.  She denies numbness or tingling in her fingers and can feel touch on her fingertips. Due to her injury, she is not driving at the moment but continues to engage in activities such as cooking.  Her past medical history includes arthritis and hypertension. She resides in [facility] and has a partial upper denture that is removable. 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  Review of Systems  Patient Active Problem List Diagnosis Hypercholesterolemia Essential hypertension Osteoporosis PVD (peripheral vascular disease) (CMS-HCC) Health maintenance alteration Aortic atherosclerosis (CMS-HCC) Other specified anemias Status post total knee replacement using cement, right Prediabetes  Outpatient Medications Prior to Visit Medication Sig Dispense Refill alendronate (FOSAMAX) 35 MG tablet Take 1 tablet (35 mg total) by mouth every 7 (seven) days Take with a full glass of water. Do not lie down for the next 30 min. 12 tablet 3 ascorbic acid, vitamin C, (VITAMIN C) 500 MG tablet Take 500 mg by mouth once daily atorvastatin (LIPITOR) 40 MG tablet Take 1 tablet (40 mg total) by mouth once daily 90 tablet 1 bisacodyL (DULCOLAX) 5 mg EC tablet Take 5 mg by mouth once daily as needed for Constipation cetirizine (ZYRTEC) 10 MG tablet Take 10 mg by mouth once daily cholecalciferol (VITAMIN D3) 2,000 unit tablet Take  2,000 Units by mouth once daily diclofenac (VOLTAREN) 1 % topical gel Apply 2 g topically 4 (four) times daily 100 g 11 folic acid (FOLVITE) 1 MG tablet Take by mouth once daily HYDROcodone-acetaminophen (NORCO) 5-325 mg tablet losartan-hydroCHLOROthiazide (HYZAAR) 50-12.5 mg tablet take 1 tablet by mouth every day 90 tablet 3 melatonin 3 mg tablet Take 1 tablet by mouth nightly multivit-minerals/folic/ginkgo (ONE DAILY WOMEN 50 PLUS ORAL) Take 1 tablet by mouth once daily RESTASIS 0.05 % ophthalmic emulsion Place 1 drop into both eyes 2 (two) times daily hydroCHLOROthiazide (HYDRODIURIL) 12.5 MG tablet Take 1 tablet (12.5 mg total) by mouth once daily (Patient not taking: Reported on 10/11/2023) 90 tablet 0 montelukast (SINGULAIR) 10 mg tablet Take 1 tablet (10 mg total) by mouth at bedtime (Patient not taking: Reported on 10/11/2023) 90 tablet 3 oxyBUTYnin (DITROPAN-XL) 5 MG XL tablet Take 1 tablet (5 mg total) by mouth once daily (Patient not taking: Reported on 10/11/2023) 90 tablet 3 pyridoxine, vitamin B6, (VITAMIN B-6) 50 MG tablet Take 50 mg by mouth once daily (Patient not taking: Reported on 10/11/2023) triamcinolone 0.1 % cream Apply topically 2 (two) times daily (Patient not taking: Reported on 10/11/2023) 453.6 g 1 ZINC ORAL Take 50 mg by mouth once daily (Patient not taking: Reported on 10/11/2023)  No facility-administered medications prior to visit.    Objective  Vitals: 10/11/23 0856 BP: (!) 152/78 Weight: 78.9 kg (174 lb) Height: 162.6 cm (5\' 4" ) PainSc: 5 PainLoc: Wrist  Body mass index is 29.87 kg/m.  Home Vitals:   Physical Exam Physical Exam CHEST: Lungs clear to auscultation bilaterally. CARDIOVASCULAR:  Heart regular rate and rhythm.  Constitutional: alert, in NAD, and communicates well  Results    Assessment/Plan:  Assessment & Plan Wrist Fracture Sustained from a fall on 09/30/2023. Closed reduction performed in ER. Patient has no numbness or tingling in  fingers. Sensation intact. Discussed the need for surgery to prevent loss of function and improve recovery. -Schedule surgery for 10/12/2023 at [facility]. -Post-operative plan includes immediate finger movement, incision check later in the week, stitch removal at 2 weeks, and expected full recovery at 6 weeks.  Diagnoses and all orders for this visit:  Acute pain of right wrist  Closed Colles' fracture of right radius, initial encounter  This visit was coded based on medical decision making (MDM).    Future Appointments  Date/Time Provider Department Center Visit Type 10/15/2023 9:30 AM Marlena Clipper, MD [facility] C POST-OP 10/27/2023 2:30 PM Patience Musca, PA [facility] C POST-OP 11/10/2023 10:30 AM Patience Musca, PA [facility] C POST-OP 11/11/2023 9:15 AM [facility] C LAB 11/18/2023 9:00 AM Allyn Kenner, MD [facility] C PHYSICAL    There are no Patient Instructions on file for this visit.  An after visit summary was provided for the patient either in written format (printed) or through MyChart.  This note has been created using automated tools and reviewed for accuracy by [facility].  Electronically signed by Marlena Clipper, MD at 10/11/2023 11:48 AM EST    Reviewed  H+P. No changes noted.

## 2023-10-13 ENCOUNTER — Encounter: Payer: Self-pay | Admitting: Orthopedic Surgery

## 2023-11-04 ENCOUNTER — Ambulatory Visit: Payer: PRIVATE HEALTH INSURANCE | Attending: Orthopedic Surgery | Admitting: Occupational Therapy

## 2023-11-04 ENCOUNTER — Encounter: Payer: Self-pay | Admitting: Occupational Therapy

## 2023-11-04 ENCOUNTER — Ambulatory Visit: Payer: PRIVATE HEALTH INSURANCE | Admitting: Occupational Therapy

## 2023-11-04 DIAGNOSIS — R600 Localized edema: Secondary | ICD-10-CM | POA: Diagnosis present

## 2023-11-04 DIAGNOSIS — M25531 Pain in right wrist: Secondary | ICD-10-CM | POA: Diagnosis present

## 2023-11-04 DIAGNOSIS — M6281 Muscle weakness (generalized): Secondary | ICD-10-CM | POA: Insufficient documentation

## 2023-11-04 DIAGNOSIS — M25641 Stiffness of right hand, not elsewhere classified: Secondary | ICD-10-CM | POA: Diagnosis present

## 2023-11-04 DIAGNOSIS — M79641 Pain in right hand: Secondary | ICD-10-CM | POA: Insufficient documentation

## 2023-11-04 DIAGNOSIS — M25631 Stiffness of right wrist, not elsewhere classified: Secondary | ICD-10-CM | POA: Diagnosis present

## 2023-11-04 DIAGNOSIS — L905 Scar conditions and fibrosis of skin: Secondary | ICD-10-CM | POA: Insufficient documentation

## 2023-11-04 NOTE — Therapy (Signed)
 OUTPATIENT OCCUPATIONAL THERAPY ORTHO EVALUATION  Patient Name: Anna Campbell MRN: [id number] DOB:14-Nov-1940, 83 y.o., female Today's Date: 11/04/2023  PCP: Dr Delano Metz PROVIDER: Floyce Stakes PA  END OF SESSION:  OT End of Session - 11/04/23 1800     Visit Number 1    Number of Visits 16    Date for OT Re-Evaluation 12/30/23    OT Start Time 1146    OT Stop Time 1248    OT Time Calculation (min) 62 min    Activity Tolerance Patient tolerated treatment well    Behavior During Therapy WFL for tasks assessed/performed             Past Medical History:  Diagnosis Date   Aortic atherosclerosis (HCC)    Hypercholesteremia    Hypertension    Osteoporosis    PVD (peripheral vascular disease) (HCC)    Wears dentures    partial  upper   Past Surgical History:  Procedure Laterality Date   ABDOMINAL HYSTERECTOMY     BIOPSY  04/06/2023   Procedure: BIOPSY;  Surgeon: Regis Bill, MD;  Location: [facility] ENDOSCOPY;  Service: Endoscopy;;   CHOLECYSTECTOMY     ESOPHAGOGASTRODUODENOSCOPY (EGD) WITH PROPOFOL N/A 04/06/2023   Procedure: ESOPHAGOGASTRODUODENOSCOPY (EGD) WITH PROPOFOL;  Surgeon: Regis Bill, MD;  Location: [facility] ENDOSCOPY;  Service: Endoscopy;  Laterality: N/A;  SPANISH INTERPRETER   JOINT REPLACEMENT     OPEN REDUCTION INTERNAL FIXATION (ORIF) DISTAL RADIAL FRACTURE Right 10/12/2023   Procedure: OPEN REDUCTION INTERNAL FIXATION (ORIF) DISTAL RADIUS FRACTURE;  Surgeon: Kennedy Bucker, MD;  Location: [facility];  Service: Orthopedics;  Laterality: Right;   SCLEROTHERAPY  2024   Patient Active Problem List   Diagnosis Date Noted   Syncope 09/15/2021   Syncope and collapse 09/14/2021   AKI (acute kidney injury) (HCC) 09/14/2021   Anemia 09/14/2021   Aortic atherosclerosis (HCC) 02/08/2020   Essential hypertension 06/27/2019   Osteoporosis 06/27/2019   PVD (peripheral vascular disease) (HCC) 06/27/2019    ONSET DATE: 10/12/23  REFERRING DIAG:  R distal radius fx with ORIF  THERAPY DIAG:  Stiffness of right wrist, not elsewhere classified  Stiffness of right hand, not elsewhere classified  Localized edema  Scar condition and fibrosis of skin  Pain in right wrist  Pain in right hand  Muscle weakness (generalized)  Rationale for Evaluation and Treatment: Rehabilitation  SUBJECTIVE:   SUBJECTIVE STATEMENT: I slipped and fell and broke my wrist.  My elbow my shoulder hurts too.  I kept in the sling for a while and the cast was heavy.  I have been only wearing the brace when I go out and about doing sleeping. That is what the Dr told me - and I have been squeezing a ball Pt accompanied by: friend  PERTINENT HISTORY: Ortho note 10/27/23 examination of the right upper extremity shows mild swelling throughout the wrist and digits. Close to full composite fist. Full extension of the digits. She is tender along the distal radial metaphyseal region. Incision site appears well. Sutures removed, Steri-Strips applied. She has no significant swelling throughout the forearm.  Impression: S/P ORIF (open reduction internal fixation) fracture [Z98.890, Z87.81] S/P ORIF (open reduction internal fixation) fracture (primary encounter diagnosis)  Plan:  9. 83 year old female status post right distal radius ORIF. Having normal postoperative pain swelling and some stiffness. Patient will start naproxen 500 mg twice daily with food for 2 weeks. She will discontinue sling and work on shoulder and elbow range of motion as she reports  mild soreness in these areas along with stiffness in her shoulder and elbow. We will refer to outpatient hand therapy. She will continue with Velcro wrist brace only remove when showering and working on gentle wrist range of motion exercises. She will wear the brace when she is sleeping. She will continue nonweightbearing on the right upper extremity. Follow-up in 3 weeks for x-rays of the right wrist   PRECAUTIONS: Keep  wrist brace on most all the time except when working on range of motion when sitting.  No weightbearing.  On right upper extremity    WEIGHT BEARING RESTRICTIONS: No weightbearing on right upper extremity  PAIN:  Are you having pain?  5/10 pain at right wrist and hand  FALLS: Has patient fallen in last 6 months? Yes. Number of falls 2  LIVING ENVIRONMENT: Lives with: lives alone  PLOF: Retired but was still independent -with basic ADLs as well as laundry and making her bed and cooking and homemaking activities.  Including driving per patient-had normal active range of motion and strength in right upper extremity  PATIENT GOALS: I want to get the swelling and the pain better in my motion and strength so that I can use it  NEXT MD VISIT: 11/10/2023  OBJECTIVE:  Note: Objective measures were completed at Evaluation unless otherwise noted.  HAND DOMINANCE: Right  ADLs: Patient using her right hand less than 25% in basic ADLs.  Unable to lift, grip, carry, pinch, pushing and pulling.  FUNCTIONAL OUTCOME MEASURES: Will assess next session  UPPER EXTREMITY ROM:     Active ROM Right eval Left eval  Shoulder flexion    Shoulder abduction    Shoulder adduction    Shoulder extension    Shoulder internal rotation    Shoulder external rotation    Elbow flexion    Elbow extension    Wrist flexion 30   Wrist extension 44   Wrist ulnar deviation 10   Wrist radial deviation 20   Wrist pronation 90   Wrist supination 55   (Blank rows = not tested)  Active ROM Right eval Left eval  Thumb MCP (0-60)    Thumb IP (0-80)    Thumb Radial abd/add (0-55) 40    Thumb Palmar abd/add (0-45) 45    Thumb Opposition to Small Finger     Index MCP (0-90) 75    Index PIP (0-100) 75    Index DIP (0-70)      Long MCP (0-90)  80    Long PIP (0-100)  85    Long DIP (0-70)      Ring MCP (0-90)  80    Ring PIP (0-100)  100    Ring DIP (0-70)      Little MCP (0-90)  90    Little PIP (0-100)   90    Little DIP (0-70)      (Blank rows = not tested)   UPPER EXTREMITY MMT:     Not tested patient is 3 weeks postop  MMT Right eval Left eval  Shoulder flexion    Shoulder abduction    Shoulder adduction    Shoulder extension    Shoulder internal rotation    Shoulder external rotation    Middle trapezius    Lower trapezius    Elbow flexion    Elbow extension    Wrist flexion    Wrist extension    Wrist ulnar deviation    Wrist radial deviation    Wrist pronation  Wrist supination    (Blank rows = not tested)  HAND FUNCTION: Grip strength: Right:   lbs; Left:   lbs, Lateral pinch: Right:   lbs, Left:   lbs, and 3 point pinch: Right:   lbs, Left:   lbs  COORDINATION: Impaired because of immobilization of the wrist as well as increased swelling and pain.  As well as stiffness in fingers and decreased flexion and endrange extension in all digits  SENSATION: Patient denies any sensory changes in the hand  EDEMA: Increase edema in right hand and wrist  COGNITION: Overall cognitive status: Within functional limits for tasks assessed      TREATMENT DATE: 11/04/23                                                                                                                          Modalities: Contrast bath:  Time: 8 min Location: Right hand and wrist To decrease edema and pain and increase motion prior to review of home exercises.  Patient to do contrast 3 times a day.  Educated in procedure. Fitted patient with Tubigrip D to wear while doing home exercises after contrast if needed as well as under splint.   Reviewed with patient again doctor's orders about wearing her wrist (most all the time including sleeping.  Can take it off when sitting to work on range of motion gentle.  Home exercises 3 times per day after contrast 12 reps Thumb palmar radial abduction Tendon glides Opposition to all digits with thumb radial abduction in between  Active range  of motion for wrist flexion extension, radial ulnar deviation and Supination and pronation focusing on forearm motion with elbow adducted to side.  Patient can also do active range of motion for bilateral shoulder flexion and abduction and external rotation 12 reps 3 times a day Scapular retraction 3 times a day 12 reps.       PATIENT EDUCATION: Education details: findings of eval and HEP  Person educated: Patient Education method: Explanation, Demonstration, Tactile cues, Verbal cues, and Handouts Education comprehension: verbalized understanding, returned demonstration, verbal cues required, and needs further education  Education comprehension: verbalized understanding, returned demonstration, verbal cues required, and needs further education   GOALS: Goals reviewed with patient? Yes   LONG TERM GOALS: Target date: 8 wks  Patient to be independent in home program to decrease edema and pain to increase thumb and digit range of motion to composite fist with no increase symptoms Baseline: MC flexion 75 to 90 degrees and PIP's 75 to 100 degrees with increased pain.  Increased tightness with extension of all digits with PIP extension -10 degrees, decreased palmar radial abduction of thumb with increased pain.  No knowledge of home program. Goal status: INITIAL  2.  Patient to be independent in scar management including scar pad use and scar massage to increase adhesion and increased wrist flexion extension. Baseline: Patient still has 3 Steri-Strips on.  Scar adherent.  With decreased wrist flexion  and extension at 30 and 44 degrees. Goal status: INITIAL  3.  Right forearm supination increased to Baseline: Supination at 55 degrees with increased pain. Goal status: INITIAL  4.  Right wrist flexion extension improved to within functional limits for patient to be able to use hand and more than 75% of bathing and dressing without increased symptoms Baseline: Right wrist flexion 30  degrees and extension 44, ulnar deviation 10 degrees.  Using right hand less than 25% in ADLs. Goal status: INITI supination AL  5.  Right grip and prehension strength increased to more than 50% compared to the left for patient to initiate carrying groceries, hold glass, cut with a knife, hold a plate without increase symptoms Baseline: Grip and prehension not tested 3 weeks postop Goal status: INITIAL  6.  Patient to be independent in home program to increase shoulder and elbow active range of motion to within functional limits symptom-free. Baseline: Patient report and show increase stiffness and discomfort in right shoulder range of motion in all planes with internal rotation the worst.  And elbow extension. Goal status: INITIAL  ASSESSMENT:  CLINICAL IMPRESSION: Patient was seen today for occupational therapy evaluation for right distal radius fracture with ORIF on 10/12/2023 by Dr. Rosita Kea.  Patient present with 3 Steri-Strips still in place.  Patient arrived with prefab wrist splint in place.  Patient report only wearing it for sleeping and going out.  Reinforced with patient she is 3 weeks postop and per doctors order needs to have splint on when up and about.  Can take it off when sitting and working on gentle range of motion.  Patient show increased edema and pain in right hand and wrist.  Patient showed decreased composite fist and digit extension including thumb palmar radial abduction and opposition.  Patient showed decreased wrist active range of motion in all planes as well as forearm supination.  Patient report increased stiffness and discomfort with slight pain in right shoulder and elbow.  Patient pain increased to 5/10 mostly in right wrist and hand.  Patient is right-hand dominant.  Patient is limited in functional use of right hand in ADLs and IADLs because of increased edema and pain, increase scar tissue and decreased motion and strength.  Patient can benefit from skilled OT services  to decrease edema and scar tissue and pain and increase motion and strength and return to prior level of function.  PERFORMANCE DEFICITS: in functional skills including ADLs, IADLs, coordination, dexterity, proprioception, edema, ROM, strength, pain, flexibility, Fine motor control, decreased knowledge of precautions, decreased knowledge of use of DME, UE functional use, and scar tissue , cognitive skills including  IMPAIRMENTS: are limiting patient from ADLs, IADLs, rest and sleep, play, leisure, and social participation.   COMORBIDITIES: may have co-morbidities  that affects occupational performance. Patient will benefit from skilled OT to address above impairments and improve overall function.  MODIFICATION OR ASSISTANCE TO COMPLETE EVALUATION: Min-Moderate modification of tasks or assist with assess necessary to complete an evaluation.  OT OCCUPATIONAL PROFILE AND HISTORY: Detailed assessment: Review of records and additional review of physical, cognitive, psychosocial history related to current functional performance.  CLINICAL DECISION MAKING: Moderate - several treatment options, min-mod task modification necessary  REHAB POTENTIAL: Good  EVALUATION COMPLEXITY: Moderate      PLAN:  OT FREQUENCY: 2x/week  OT DURATION: 8 weeks  PLANNED INTERVENTIONS: 97168 OT Re-evaluation, 97535 self care/ADL training, 29562 therapeutic exercise, 97530 therapeutic activity, 97112 neuromuscular re-education, 97140 manual therapy, 97018 paraffin, 13086 fluidotherapy,  45409 contrast bath, [phone] Orthotics management and training, scar mobilization, passive range of motion, patient/family education, and DME and/or AE instructions   CONSULTED AND AGREED WITH PLAN OF CARE: Patient and friend     Oletta Cohn, OTR/L,CLT 11/04/2023, 6:03 PM

## 2023-11-09 ENCOUNTER — Ambulatory Visit: Payer: PRIVATE HEALTH INSURANCE | Attending: Orthopedic Surgery | Admitting: Occupational Therapy

## 2023-11-09 DIAGNOSIS — M25631 Stiffness of right wrist, not elsewhere classified: Secondary | ICD-10-CM | POA: Insufficient documentation

## 2023-11-09 DIAGNOSIS — M79641 Pain in right hand: Secondary | ICD-10-CM | POA: Insufficient documentation

## 2023-11-09 DIAGNOSIS — R600 Localized edema: Secondary | ICD-10-CM | POA: Diagnosis present

## 2023-11-09 DIAGNOSIS — M6281 Muscle weakness (generalized): Secondary | ICD-10-CM | POA: Insufficient documentation

## 2023-11-09 DIAGNOSIS — L905 Scar conditions and fibrosis of skin: Secondary | ICD-10-CM | POA: Insufficient documentation

## 2023-11-09 DIAGNOSIS — M25641 Stiffness of right hand, not elsewhere classified: Secondary | ICD-10-CM | POA: Diagnosis present

## 2023-11-09 DIAGNOSIS — M25531 Pain in right wrist: Secondary | ICD-10-CM | POA: Insufficient documentation

## 2023-11-09 NOTE — Therapy (Signed)
 OUTPATIENT OCCUPATIONAL THERAPY ORTHO TREATMENT  Patient Name: Anna Campbell MRN: [id number] DOB:03/01/1941, 83 y.o., female Today's Date: 11/09/2023  PCP: Dr Delano Metz PROVIDER: Floyce Stakes PA  END OF SESSION:  OT End of Session - 11/09/23 1111     Visit Number 2    Number of Visits 16    Date for OT Re-Evaluation 12/30/23    OT Start Time 1111    OT Stop Time 1200    OT Time Calculation (min) 49 min    Activity Tolerance Patient tolerated treatment well    Behavior During Therapy WFL for tasks assessed/performed             Past Medical History:  Diagnosis Date   Aortic atherosclerosis (HCC)    Hypercholesteremia    Hypertension    Osteoporosis    PVD (peripheral vascular disease) (HCC)    Wears dentures    partial  upper   Past Surgical History:  Procedure Laterality Date   ABDOMINAL HYSTERECTOMY     BIOPSY  04/06/2023   Procedure: BIOPSY;  Surgeon: Regis Bill, MD;  Location: [facility];  Service: Endoscopy;;   CHOLECYSTECTOMY     ESOPHAGOGASTRODUODENOSCOPY (EGD) WITH PROPOFOL N/A 04/06/2023   Procedure: ESOPHAGOGASTRODUODENOSCOPY (EGD) WITH PROPOFOL;  Surgeon: Regis Bill, MD;  Location: [facility];  Service: Endoscopy;  Laterality: N/A;  SPANISH INTERPRETER   JOINT REPLACEMENT     OPEN REDUCTION INTERNAL FIXATION (ORIF) DISTAL RADIAL FRACTURE Right 10/12/2023   Procedure: OPEN REDUCTION INTERNAL FIXATION (ORIF) DISTAL RADIUS FRACTURE;  Surgeon: Kennedy Bucker, MD;  Location: [facility];  Service: Orthopedics;  Laterality: Right;   SCLEROTHERAPY  2024   Patient Active Problem List   Diagnosis Date Noted   Syncope 09/15/2021   Syncope and collapse 09/14/2021   AKI (acute kidney injury) (HCC) 09/14/2021   Anemia 09/14/2021   Aortic atherosclerosis (HCC) 02/08/2020   Essential hypertension 06/27/2019   Osteoporosis 06/27/2019   PVD (peripheral vascular disease) (HCC) 06/27/2019    ONSET DATE: 10/12/23  REFERRING DIAG: R  distal radius fx with ORIF  THERAPY DIAG:  Stiffness of right wrist, not elsewhere classified  Stiffness of right hand, not elsewhere classified  Localized edema  Scar condition and fibrosis of skin  Pain in right wrist  Muscle weakness (generalized)  Pain in right hand  Rationale for Evaluation and Treatment: Rehabilitation  SUBJECTIVE:   SUBJECTIVE STATEMENT: See note Pt accompanied by: friend  PERTINENT HISTORY: Ortho note 10/27/23 examination of the right upper extremity shows mild swelling throughout the wrist and digits. Close to full composite fist. Full extension of the digits. She is tender along the distal radial metaphyseal region. Incision site appears well. Sutures removed, Steri-Strips applied. She has no significant swelling throughout the forearm.  Impression: S/P ORIF (open reduction internal fixation) fracture [Z98.890, Z87.81] S/P ORIF (open reduction internal fixation) fracture (primary encounter diagnosis)  Plan:  10. 83 year old female status post right distal radius ORIF. Having normal postoperative pain swelling and some stiffness. Patient will start naproxen 500 mg twice daily with food for 2 weeks. She will discontinue sling and work on shoulder and elbow range of motion as she reports mild soreness in these areas along with stiffness in her shoulder and elbow. We will refer to outpatient hand therapy. She will continue with Velcro wrist brace only remove when showering and working on gentle wrist range of motion exercises. She will wear the brace when she is sleeping. She will continue nonweightbearing on the right upper extremity.  Follow-up in 3 weeks for x-rays of the right wrist   PRECAUTIONS: Keep wrist brace on most all the time except when working on range of motion when sitting.  No weightbearing.  On right upper extremity    WEIGHT BEARING RESTRICTIONS: No weightbearing on right upper extremity  PAIN:  Are you having pain?  5/10 pain at right  wrist and hand  FALLS: Has patient fallen in last 6 months? Yes. Number of falls 2  LIVING ENVIRONMENT: Lives with: lives alone  PLOF: Retired but was still independent -with basic ADLs as well as laundry and making her bed and cooking and homemaking activities.  Including driving per patient-had normal active range of motion and strength in right upper extremity  PATIENT GOALS: I want to get the swelling and the pain better in my motion and strength so that I can use it  NEXT MD VISIT: 11/10/2023  OBJECTIVE:  Note: Objective measures were completed at Evaluation unless otherwise noted.  HAND DOMINANCE: Right  ADLs: Patient using her right hand less than 25% in basic ADLs.  Unable to lift, grip, carry, pinch, pushing and pulling.  FUNCTIONAL OUTCOME MEASURES: Will assess next session  UPPER EXTREMITY ROM:     Active ROM Right eval Left eval R 11/09/23  Shoulder flexion     Shoulder abduction     Shoulder adduction     Shoulder extension     Shoulder internal rotation     Shoulder external rotation     Elbow flexion     Elbow extension     Wrist flexion 30  40  Wrist extension 44  45  Wrist ulnar deviation 10  20  Wrist radial deviation 20  30  Wrist pronation 90  90  Wrist supination 55  70  (Blank rows = not tested)  Active ROM Right eval Left eval R 11/09/23  Thumb MCP (0-60)     Thumb IP (0-80)     Thumb Radial abd/add (0-55) 40   48  Thumb Palmar abd/add (0-45) 45   58  Thumb Opposition to Small Finger    Opposition to 5th   Index MCP (0-90) 75   80  Index PIP (0-100) 75   90  Index DIP (0-70)       Long MCP (0-90)  80   85  Long PIP (0-100)  85   90  Long DIP (0-70)       Ring MCP (0-90)  80   85  Ring PIP (0-100)  100   100  Ring DIP (0-70)       Little MCP (0-90)  90   90  Little PIP (0-100)  90   90  Little DIP (0-70)       (Blank rows = not tested)    HAND FUNCTION: NT  3 wks s/p at eval Grip strength: Right:   lbs; Left:   lbs, Lateral  pinch: Right:   lbs, Left:   lbs, and 3 point pinch: Right:   lbs, Left:   lbs  COORDINATION: Impaired because of immobilization of the wrist as well as increased swelling and pain.  As well as stiffness in fingers and decreased flexion and endrange extension in all digits  SENSATION: Patient denies any sensory changes in the hand  EDEMA: Increase edema in right hand and wrist  COGNITION: Overall cognitive status: Within functional limits for tasks assessed      TREATMENT DATE: 11/09/23    Pt show progress  in L wrist and digits AROM - see flowsheet      Last 3 sterri strips removed and pt ed on scar mobs - cica scar pad provided for night time       Pt report her elbow and shoulder bothering her - report have history of shoulder pain on the R  Assess and review  R shoulder and elbow extention HEP  Pt to focus on AAROM on wall for shoulder flexion and ABD  12 reps - keeping pain free and focus on shoulder not elevating shoulder Elbow ext on door frame- 3 positions - 12 reps                                                                                                               Patient to cont with contrast 3 times a day.  Fitted patient with Tubigrip D  again to wear while doing home exercises after contrast if needed as well as under splint.   Cont Home exercises 3 times per day after contrast 12 reps Thumb palmar radial abduction pain free  Tendon glides - need min v/c Opposition to all digits with thumb radial abduction in between  Active range of motion for wrist flexion extension, radial ulnar deviation and Supination and pronation focusing on forearm motion with elbow adducted to side. 12 reps pain free         PATIENT EDUCATION: Education details: findings of eval and HEP  Person educated: Patient Education method: Explanation, Demonstration, Tactile cues, Verbal cues, and Handouts Education comprehension: verbalized understanding, returned demonstration,  verbal cues required, and needs further education  Education comprehension: verbalized understanding, returned demonstration, verbal cues required, and needs further education   GOALS: Goals reviewed with patient? Yes   LONG TERM GOALS: Target date: 8 wks  Patient to be independent in home program to decrease edema and pain to increase thumb and digit range of motion to composite fist with no increase symptoms Baseline: MC flexion 75 to 90 degrees and PIP's 75 to 100 degrees with increased pain.  Increased tightness with extension of all digits with PIP extension -10 degrees, decreased palmar radial abduction of thumb with increased pain.  No knowledge of home program. Goal status: INITIAL  2.  Patient to be independent in scar management including scar pad use and scar massage to increase adhesion and increased wrist flexion extension. Baseline: Patient still has 3 Steri-Strips on.  Scar adherent.  With decreased wrist flexion and extension at 30 and 44 degrees. Goal status: INITIAL  3.  Right forearm supination increased to Baseline: Supination at 55 degrees with increased pain. Goal status: INITIAL  4.  Right wrist flexion extension improved to within functional limits for patient to be able to use hand and more than 75% of bathing and dressing without increased symptoms Baseline: Right wrist flexion 30 degrees and extension 44, ulnar deviation 10 degrees.  Using right hand less than 25% in ADLs. Goal status: INITI supination AL  5.  Right grip and prehension strength increased to  more than 50% compared to the left for patient to initiate carrying groceries, hold glass, cut with a knife, hold a plate without increase symptoms Baseline: Grip and prehension not tested 3 weeks postop Goal status: INITIAL  6.  Patient to be independent in home program to increase shoulder and elbow active range of motion to within functional limits symptom-free. Baseline: Patient report and show  increase stiffness and discomfort in right shoulder range of motion in all planes with internal rotation the worst.  And elbow extension. Goal status: INITIAL  ASSESSMENT:  CLINICAL IMPRESSION: Patient seen by  occupational therapy for right distal radius fracture with ORIF on 10/12/2023 by Dr. Rosita Kea.  Patient last sterri strips removed -and scar massage and cica scar pad for night time initiated -  Patient arrived with prefab wrist splint in place. She report following Dr order since reminded on eval.  Patient edema decreasing - since eval  and pain improved.  Patient show increase wrist and forearm AROM as well as digits extention and flexion. Pt report decrease elbow ext and shoulder pain - review some HEP for pt -  Patient is right-hand dominant. Pt did had yrs ago thumb fx - had pain coming in but decrease during session- Patient is limited in functional use of right hand in ADLs and IADLs because of increased edema and pain, increase scar tissue and decreased motion and strength.  Patient can benefit from skilled OT services to decrease edema and scar tissue and pain and increase motion and strength and return to prior level of function.  PERFORMANCE DEFICITS: in functional skills including ADLs, IADLs, coordination, dexterity, proprioception, edema, ROM, strength, pain, flexibility, Fine motor control, decreased knowledge of precautions, decreased knowledge of use of DME, UE functional use, and scar tissue , cognitive skills including  IMPAIRMENTS: are limiting patient from ADLs, IADLs, rest and sleep, play, leisure, and social participation.   COMORBIDITIES: may have co-morbidities  that affects occupational performance. Patient will benefit from skilled OT to address above impairments and improve overall function.  MODIFICATION OR ASSISTANCE TO COMPLETE EVALUATION: Min-Moderate modification of tasks or assist with assess necessary to complete an evaluation.  OT OCCUPATIONAL PROFILE AND HISTORY:  Detailed assessment: Review of records and additional review of physical, cognitive, psychosocial history related to current functional performance.  CLINICAL DECISION MAKING: Moderate - several treatment options, min-mod task modification necessary  REHAB POTENTIAL: Good  EVALUATION COMPLEXITY: Moderate      PLAN:  OT FREQUENCY: 2x/week  OT DURATION: 8 weeks  PLANNED INTERVENTIONS: 97168 OT Re-evaluation, 97535 self care/ADL training, 16109 therapeutic exercise, 97530 therapeutic activity, 97112 neuromuscular re-education, 97140 manual therapy, 97018 paraffin, 60454 fluidotherapy, 97034 contrast bath, 97760 Orthotics management and training, scar mobilization, passive range of motion, patient/family education, and DME and/or AE instructions   CONSULTED AND AGREED WITH PLAN OF CARE: Patient and friend     Oletta Cohn, OTR/L,CLT 11/09/2023, 12:27 PM

## 2023-11-15 ENCOUNTER — Encounter: Payer: PRIVATE HEALTH INSURANCE | Admitting: Occupational Therapy

## 2023-11-18 ENCOUNTER — Encounter: Payer: PRIVATE HEALTH INSURANCE | Admitting: Occupational Therapy

## 2023-11-22 ENCOUNTER — Encounter: Payer: PRIVATE HEALTH INSURANCE | Admitting: Occupational Therapy

## 2023-11-23 ENCOUNTER — Ambulatory Visit: Payer: PRIVATE HEALTH INSURANCE | Admitting: Occupational Therapy

## 2023-11-23 DIAGNOSIS — L905 Scar conditions and fibrosis of skin: Secondary | ICD-10-CM

## 2023-11-23 DIAGNOSIS — M6281 Muscle weakness (generalized): Secondary | ICD-10-CM

## 2023-11-23 DIAGNOSIS — M25631 Stiffness of right wrist, not elsewhere classified: Secondary | ICD-10-CM | POA: Diagnosis not present

## 2023-11-23 DIAGNOSIS — M79641 Pain in right hand: Secondary | ICD-10-CM

## 2023-11-23 DIAGNOSIS — M25641 Stiffness of right hand, not elsewhere classified: Secondary | ICD-10-CM

## 2023-11-23 DIAGNOSIS — R600 Localized edema: Secondary | ICD-10-CM

## 2023-11-23 DIAGNOSIS — M25531 Pain in right wrist: Secondary | ICD-10-CM

## 2023-11-23 NOTE — Therapy (Signed)
 OUTPATIENT OCCUPATIONAL THERAPY ORTHO TREATMENT  Patient Name: Anna Campbell MRN: [id number] DOB:05-02-1941, 83 y.o., female Today's Date: 11/23/2023  PCP: Dr Delano Metz PROVIDER: Floyce Stakes PA  END OF SESSION:  OT End of Session - 11/23/23 0945     Visit Number 3    Number of Visits 16    Date for OT Re-Evaluation 12/30/23    OT Start Time 0945    OT Stop Time 1031    OT Time Calculation (min) 46 min    Activity Tolerance Patient tolerated treatment well    Behavior During Therapy [facility] for tasks assessed/performed             Past Medical History:  Diagnosis Date   Aortic atherosclerosis (HCC)    Hypercholesteremia    Hypertension    Osteoporosis    PVD (peripheral vascular disease) (HCC)    Wears dentures    partial  upper   Past Surgical History:  Procedure Laterality Date   ABDOMINAL HYSTERECTOMY     BIOPSY  04/06/2023   Procedure: BIOPSY;  Surgeon: Regis Bill, MD;  Location: [facility];  Service: Endoscopy;;   CHOLECYSTECTOMY     ESOPHAGOGASTRODUODENOSCOPY (EGD) WITH PROPOFOL N/A 04/06/2023   Procedure: ESOPHAGOGASTRODUODENOSCOPY (EGD) WITH PROPOFOL;  Surgeon: Regis Bill, MD;  Location: [facility];  Service: Endoscopy;  Laterality: N/A;  SPANISH INTERPRETER   JOINT REPLACEMENT     OPEN REDUCTION INTERNAL FIXATION (ORIF) DISTAL RADIAL FRACTURE Right 10/12/2023   Procedure: OPEN REDUCTION INTERNAL FIXATION (ORIF) DISTAL RADIUS FRACTURE;  Surgeon: Kennedy Bucker, MD;  Location: [facility];  Service: Orthopedics;  Laterality: Right;   SCLEROTHERAPY  2024   Patient Active Problem List   Diagnosis Date Noted   Syncope 09/15/2021   Syncope and collapse 09/14/2021   AKI (acute kidney injury) (HCC) 09/14/2021   Anemia 09/14/2021   Aortic atherosclerosis (HCC) 02/08/2020   Essential hypertension 06/27/2019   Osteoporosis 06/27/2019   PVD (peripheral vascular disease) (HCC) 06/27/2019    ONSET DATE: 10/12/23  REFERRING DIAG: R  distal radius fx with ORIF  THERAPY DIAG:  Stiffness of right wrist, not elsewhere classified  Stiffness of right hand, not elsewhere classified  Localized edema  Scar condition and fibrosis of skin  Pain in right wrist  Pain in right hand  Muscle weakness (generalized)  Rationale for Evaluation and Treatment: Rehabilitation  SUBJECTIVE:   SUBJECTIVE STATEMENT: See note Pt accompanied by: friend  PERTINENT HISTORY: Ortho note 10/27/23 examination of the right upper extremity shows mild swelling throughout the wrist and digits. Close to full composite fist. Full extension of the digits. She is tender along the distal radial metaphyseal region. Incision site appears well. Sutures removed, Steri-Strips applied. She has no significant swelling throughout the forearm.  Impression: S/P ORIF (open reduction internal fixation) fracture [Z98.890, Z87.81] S/P ORIF (open reduction internal fixation) fracture (primary encounter diagnosis)  Plan:  8. 83 year old female status post right distal radius ORIF. Having normal postoperative pain swelling and some stiffness. Patient will start naproxen 500 mg twice daily with food for 2 weeks. She will discontinue sling and work on shoulder and elbow range of motion as she reports mild soreness in these areas along with stiffness in her shoulder and elbow. We will refer to outpatient hand therapy. She will continue with Velcro wrist brace only remove when showering and working on gentle wrist range of motion exercises. She will wear the brace when she is sleeping. She will continue nonweightbearing on the right upper extremity.  Follow-up in 3 weeks for x-rays of the right wrist   PRECAUTIONS: Keep wrist brace on most all the time except when working on range of motion when sitting.  No weightbearing.  On right upper extremity    WEIGHT BEARING RESTRICTIONS: No weightbearing on right upper extremity  PAIN:  Are you having pain?  5/10 pain at right  wrist and hand  FALLS: Has patient fallen in last 6 months? Yes. Number of falls 2  LIVING ENVIRONMENT: Lives with: lives alone  PLOF: Retired but was still independent -with basic ADLs as well as laundry and making her bed and cooking and homemaking activities.  Including driving per patient-had normal active range of motion and strength in right upper extremity  PATIENT GOALS: I want to get the swelling and the pain better in my motion and strength so that I can use it  NEXT MD VISIT: 11/10/2023  OBJECTIVE:  Note: Objective measures were completed at Evaluation unless otherwise noted.  HAND DOMINANCE: Right  ADLs: Patient using her right hand less than 25% in basic ADLs.  Unable to lift, grip, carry, pinch, pushing and pulling.  FUNCTIONAL OUTCOME MEASURES: Will assess next session  UPPER EXTREMITY ROM:     Active ROM Right eval Left eval R 11/09/23 R 11/23/23  Shoulder flexion      Shoulder abduction      Shoulder adduction      Shoulder extension      Shoulder internal rotation      Shoulder external rotation      Elbow flexion      Elbow extension      Wrist flexion 30  40 45  Wrist extension 44  45 54  Wrist ulnar deviation 10  20 20   Wrist radial deviation 20  30 30   Wrist pronation 90  90 90  Wrist supination 55  70 85  (Blank rows = not tested)  Active ROM Right eval Left eval R 11/09/23 R 11/23/23  Thumb MCP (0-60)      Thumb IP (0-80)      Thumb Radial abd/add (0-55) 40   48 50  Thumb Palmar abd/add (0-45) 45   58 55  Thumb Opposition to Small Finger    Opposition to 5th    Index MCP (0-90) 75   80 85  Index PIP (0-100) 75   90 95  Index DIP (0-70)        Long MCP (0-90)  80   85 90  Long PIP (0-100)  85   90 100  Long DIP (0-70)        Ring MCP (0-90)  80   85 90  Ring PIP (0-100)  100   100 100  Ring DIP (0-70)        Little MCP (0-90)  90   90 90  Little PIP (0-100)  90   90 95  Little DIP (0-70)        (Blank rows = not tested)    HAND  FUNCTION: 4/15/25Grip strength: Right: 25 lbs; Left: 59 lbs, Lateral pinch: Right: 6 lbs, Left: 12 lbs, and 3 point pinch: Right: 6 lbs, Left: 11 lbs  COORDINATION:  EVAL - Impaired because of immobilization of the wrist as well as increased swelling and pain.  As well as stiffness in fingers and decreased flexion and endrange extension in all digits  SENSATION: Patient denies any sensory changes in the hand  EDEMA: Increase edema in right hand and wrist  COGNITION: Overall cognitive status: Within functional limits for tasks assessed      TREATMENT DATE: 11/23/23    Patient return after not being seen for a week or 2.  Because of scheduling issues.  And doctors appointments. Patient present at OT session with increased l right wrist and digit active range of motion with less pain. - see flowsheet     Also assess grip and prehension strength for baseline.  See flowsheet      Pt report her elbow and shoulder bothering her still from the fall- report have history of shoulder pain on the R  Patient seen orthopedics since last time.  Patient has a follow-up appointment for possible shot for the shoulder.  Patient             Fuido therapy 8 min -       for right wrist and digit active range of motion to decrease stiffness and pain prior to manual massage and review and upgrade of home exercises                                                                                          Patient to cont with contrast 3 times a day if have increased edema Patient wearing her wrist splint about 50% of the time Report increased use in bathing and dressing and cooking.  Continue with home exercises 2-3 times a day Thumb palmar radial abduction pain free  After soft tissue lopes to webspace Continue with tendon glides - need min v/c As well as opposition to all digits with thumb radial abduction in between  Reviewed with patient again active assisted range of motion for wrist flexion and  extension added prayer stretch As well as active assisted range of motion for radial ulnar deviation as well as supination Add and upgrade 1 pound weight for wrist and forearm in all planes 12 reps 2 times a day symptom-free. Patient tolerating well.       PATIENT EDUCATION: Education details: findings of eval and HEP  Person educated: Patient Education method: Explanation, Demonstration, Tactile cues, Verbal cues, and Handouts Education comprehension: verbalized understanding, returned demonstration, verbal cues required, and needs further education  Education comprehension: verbalized understanding, returned demonstration, verbal cues required, and needs further education   GOALS: Goals reviewed with patient? Yes   LONG TERM GOALS: Target date: 8 wks  Patient to be independent in home program to decrease edema and pain to increase thumb and digit range of motion to composite fist with no increase symptoms Baseline: MC flexion 75 to 90 degrees and PIP's 75 to 100 degrees with increased pain.  Increased tightness with extension of all digits with PIP extension -10 degrees, decreased palmar radial abduction of thumb with increased pain.  No knowledge of home program. Goal status: INITIAL  2.  Patient to be independent in scar management including scar pad use and scar massage to increase adhesion and increased wrist flexion extension. Baseline: Patient still has 3 Steri-Strips on.  Scar adherent.  With decreased wrist flexion and extension at 30 and 44 degrees. Goal status: INITIAL  3.  Right forearm supination increased  to Baseline: Supination at 55 degrees with increased pain. Goal status: INITIAL  4.  Right wrist flexion extension improved to within functional limits for patient to be able to use hand and more than 75% of bathing and dressing without increased symptoms Baseline: Right wrist flexion 30 degrees and extension 44, ulnar deviation 10 degrees.  Using right hand less  than 25% in ADLs. Goal status: INITI supination AL  5.  Right grip and prehension strength increased to more than 50% compared to the left for patient to initiate carrying groceries, hold glass, cut with a knife, hold a plate without increase symptoms Baseline: Grip and prehension not tested 3 weeks postop Goal status: INITIAL  6.  Patient to be independent in home program to increase shoulder and elbow active range of motion to within functional limits symptom-free. Baseline: Patient report and show increase stiffness and discomfort in right shoulder range of motion in all planes with internal rotation the worst.  And elbow extension. Goal status: INITIAL  ASSESSMENT:  CLINICAL IMPRESSION: Patient seen by  occupational therapy for right distal radius fracture with ORIF on 10/12/2023 by Dr. Mozell Arias.   Patient had orthopedic visit since last time.  X-ray showing good healing.  Patient continue to wear prefab wrist splint about 50% of the time.  Patient reports increased functional use in ADLs and cooking.  Patient also show increase wrist and forearm active range of motion.  Able to upgrade home exercises to 1 pound weight after active assisted range of motion to wrist and forearm.  Digit range of motion improve.  Did assess grip and prehension strength.  Limited will add strengthening and extension.  Patient reports still continues to have shoulder pain since the fall.  Appear patient has orthopedic appointment for the shoulder coming up.  Patient is right-hand dominant. Pt did had yrs ago thumb fx - had pain coming in but decrease during session- Patient is limited in functional use of right hand in ADLs and IADLs because of increased edema and pain, increase scar tissue and decreased motion and strength.  Patient can benefit from skilled OT services to decrease edema and scar tissue and pain and increase motion and strength and return to prior level of function.  PERFORMANCE DEFICITS: in functional  skills including ADLs, IADLs, coordination, dexterity, proprioception, edema, ROM, strength, pain, flexibility, Fine motor control, decreased knowledge of precautions, decreased knowledge of use of DME, UE functional use, and scar tissue , cognitive skills including  IMPAIRMENTS: are limiting patient from ADLs, IADLs, rest and sleep, play, leisure, and social participation.   COMORBIDITIES: may have co-morbidities  that affects occupational performance. Patient will benefit from skilled OT to address above impairments and improve overall function.  MODIFICATION OR ASSISTANCE TO COMPLETE EVALUATION: Min-Moderate modification of tasks or assist with assess necessary to complete an evaluation.  OT OCCUPATIONAL PROFILE AND HISTORY: Detailed assessment: Review of records and additional review of physical, cognitive, psychosocial history related to current functional performance.  CLINICAL DECISION MAKING: Moderate - several treatment options, min-mod task modification necessary  REHAB POTENTIAL: Good  EVALUATION COMPLEXITY: Moderate      PLAN:  OT FREQUENCY: 2x/week  OT DURATION: 8 weeks  PLANNED INTERVENTIONS: 97168 OT Re-evaluation, 97535 self care/ADL training, 30865 therapeutic exercise, 97530 therapeutic activity, 97112 neuromuscular re-education, 97140 manual therapy, 97018 paraffin, 78469 fluidotherapy, 97034 contrast bath, 97760 Orthotics management and training, scar mobilization, passive range of motion, patient/family education, and DME and/or AE instructions   CONSULTED AND AGREED WITH PLAN OF  CARE: Patient and friend     Heloise Lobo, OTR/L,CLT 11/23/2023, 10:34 AM

## 2023-11-25 ENCOUNTER — Ambulatory Visit: Payer: PRIVATE HEALTH INSURANCE | Admitting: Occupational Therapy

## 2023-11-25 ENCOUNTER — Encounter: Payer: PRIVATE HEALTH INSURANCE | Admitting: Occupational Therapy

## 2023-11-25 DIAGNOSIS — M79641 Pain in right hand: Secondary | ICD-10-CM

## 2023-11-25 DIAGNOSIS — M6281 Muscle weakness (generalized): Secondary | ICD-10-CM

## 2023-11-25 DIAGNOSIS — M25631 Stiffness of right wrist, not elsewhere classified: Secondary | ICD-10-CM

## 2023-11-25 DIAGNOSIS — M25531 Pain in right wrist: Secondary | ICD-10-CM

## 2023-11-25 DIAGNOSIS — M25641 Stiffness of right hand, not elsewhere classified: Secondary | ICD-10-CM

## 2023-11-25 DIAGNOSIS — R600 Localized edema: Secondary | ICD-10-CM

## 2023-11-25 DIAGNOSIS — L905 Scar conditions and fibrosis of skin: Secondary | ICD-10-CM

## 2023-11-25 NOTE — Therapy (Signed)
 OUTPATIENT OCCUPATIONAL THERAPY ORTHO TREATMENT  Patient Name: Anna Campbell MRN: [id number] DOB:1941/06/02, 83 y.o., female Today's Date: 11/25/2023  PCP: Dr Delano Metz PROVIDER: Floyce Stakes PA  END OF SESSION:  OT End of Session - 11/25/23 1036     Visit Number 4    Number of Visits 16    Date for OT Re-Evaluation 12/30/23    OT Start Time 1036    Activity Tolerance Patient tolerated treatment well    Behavior During Therapy [facility] for tasks assessed/performed             Past Medical History:  Diagnosis Date   Aortic atherosclerosis (HCC)    Hypercholesteremia    Hypertension    Osteoporosis    PVD (peripheral vascular disease) (HCC)    Wears dentures    partial  upper   Past Surgical History:  Procedure Laterality Date   ABDOMINAL HYSTERECTOMY     BIOPSY  04/06/2023   Procedure: BIOPSY;  Surgeon: Regis Bill, MD;  Location: [facility];  Service: Endoscopy;;   CHOLECYSTECTOMY     ESOPHAGOGASTRODUODENOSCOPY (EGD) WITH PROPOFOL N/A 04/06/2023   Procedure: ESOPHAGOGASTRODUODENOSCOPY (EGD) WITH PROPOFOL;  Surgeon: Regis Bill, MD;  Location: [facility];  Service: Endoscopy;  Laterality: N/A;  SPANISH INTERPRETER   JOINT REPLACEMENT     OPEN REDUCTION INTERNAL FIXATION (ORIF) DISTAL RADIAL FRACTURE Right 10/12/2023   Procedure: OPEN REDUCTION INTERNAL FIXATION (ORIF) DISTAL RADIUS FRACTURE;  Surgeon: Kennedy Bucker, MD;  Location: [facility];  Service: Orthopedics;  Laterality: Right;   SCLEROTHERAPY  2024   Patient Active Problem List   Diagnosis Date Noted   Syncope 09/15/2021   Syncope and collapse 09/14/2021   AKI (acute kidney injury) (HCC) 09/14/2021   Anemia 09/14/2021   Aortic atherosclerosis (HCC) 02/08/2020   Essential hypertension 06/27/2019   Osteoporosis 06/27/2019   PVD (peripheral vascular disease) (HCC) 06/27/2019    ONSET DATE: 10/12/23  REFERRING DIAG: R distal radius fx with ORIF  THERAPY DIAG:  Stiffness of  right wrist, not elsewhere classified  Stiffness of right hand, not elsewhere classified  Localized edema  Scar condition and fibrosis of skin  Pain in right wrist  Pain in right hand  Muscle weakness (generalized)  Rationale for Evaluation and Treatment: Rehabilitation  SUBJECTIVE:   SUBJECTIVE STATEMENT: See note Pt accompanied by: friend  PERTINENT HISTORY: Ortho note 10/27/23 examination of the right upper extremity shows mild swelling throughout the wrist and digits. Close to full composite fist. Full extension of the digits. She is tender along the distal radial metaphyseal region. Incision site appears well. Sutures removed, Steri-Strips applied. She has no significant swelling throughout the forearm.  Impression: S/P ORIF (open reduction internal fixation) fracture [Z98.890, Z87.81] S/P ORIF (open reduction internal fixation) fracture (primary encounter diagnosis)  Plan:  59. 83 year old female status post right distal radius ORIF. Having normal postoperative pain swelling and some stiffness. Patient will start naproxen 500 mg twice daily with food for 2 weeks. She will discontinue sling and work on shoulder and elbow range of motion as she reports mild soreness in these areas along with stiffness in her shoulder and elbow. We will refer to outpatient hand therapy. She will continue with Velcro wrist brace only remove when showering and working on gentle wrist range of motion exercises. She will wear the brace when she is sleeping. She will continue nonweightbearing on the right upper extremity. Follow-up in 3 weeks for x-rays of the right wrist   PRECAUTIONS: Keep wrist brace  on most all the time except when working on range of motion when sitting.  No weightbearing.  On right upper extremity    WEIGHT BEARING RESTRICTIONS: No weightbearing on right upper extremity  PAIN:  Are you having pain?  5/10 pain at right wrist and hand  FALLS: Has patient fallen in last 6  months? Yes. Number of falls 2  LIVING ENVIRONMENT: Lives with: lives alone  PLOF: Retired but was still independent -with basic ADLs as well as laundry and making her bed and cooking and homemaking activities.  Including driving per patient-had normal active range of motion and strength in right upper extremity  PATIENT GOALS: I want to get the swelling and the pain better in my motion and strength so that I can use it  NEXT MD VISIT: 11/10/2023  OBJECTIVE:  Note: Objective measures were completed at Evaluation unless otherwise noted.  HAND DOMINANCE: Right  ADLs: Patient using her right hand less than 25% in basic ADLs.  Unable to lift, grip, carry, pinch, pushing and pulling.  FUNCTIONAL OUTCOME MEASURES: Will assess next session  UPPER EXTREMITY ROM:     Active ROM Right eval Left eval R 11/09/23 R 11/23/23 R 11/25/23  Shoulder flexion       Shoulder abduction       Shoulder adduction       Shoulder extension       Shoulder internal rotation       Shoulder external rotation       Elbow flexion       Elbow extension       Wrist flexion 30  40 45 70  Wrist extension 44  45 54 63  Wrist ulnar deviation 10  20 20    Wrist radial deviation 20  30 30    Wrist pronation 90  90 90   Wrist supination 55  70 85   (Blank rows = not tested)  Active ROM Right eval Left eval R 11/09/23 R 11/23/23  Thumb MCP (0-60)      Thumb IP (0-80)      Thumb Radial abd/add (0-55) 40   48 50  Thumb Palmar abd/add (0-45) 45   58 55  Thumb Opposition to Small Finger    Opposition to 5th    Index MCP (0-90) 75   80 85  Index PIP (0-100) 75   90 95  Index DIP (0-70)        Long MCP (0-90)  80   85 90  Long PIP (0-100)  85   90 100  Long DIP (0-70)        Ring MCP (0-90)  80   85 90  Ring PIP (0-100)  100   100 100  Ring DIP (0-70)        Little MCP (0-90)  90   90 90  Little PIP (0-100)  90   90 95  Little DIP (0-70)        (Blank rows = not tested)    HAND FUNCTION: 4/15/25Grip  strength: Right: 25 lbs; Left: 59 lbs, Lateral pinch: Right: 6 lbs, Left: 12 lbs, and 3 point pinch: Right: 6 lbs, Left: 11 lbs  COORDINATION:  EVAL - Impaired because of immobilization of the wrist as well as increased swelling and pain.  As well as stiffness in fingers and decreased flexion and endrange extension in all digits  SENSATION: Patient denies any sensory changes in the hand  EDEMA: Increase edema in right hand and wrist  COGNITION: Overall cognitive status: Within functional limits for tasks assessed      TREATMENT DATE: 11/25/23    Patient arrived today after being seen 2 days ago. Measurements taken for wrist and forearm in all planes. Great progress especially in flexion extension of the wrist.  Patient with 5/10 pain over dorsal wrist. Remind patient to keep pain under 2/10 The contrast prior to home exercises and stretches At table slides 20 reps for patient pain-free 2 times a day  Patient ordered from [employer] 1 pound weight.  She does have a 2 pound   grip and prehension strength for baseline.  See flowsheet      Pt report her elbow and shoulder bothering her still from the fall- report have history of shoulder pain on the R  Patient seen orthopedics since last time.  Patient has a follow-up appointment for possible shot for the shoulder.  Patient             Fuido therapy 8 min -    at end of session to decrease wrist discomfort and pain with wrist flexion Reinforced with patient again doing contrast prior to her home exercises To decrease pain increase motion                                                                                            Patient to cont with contrast 3 times a day if have increased edema Patient wearing her wrist splint about 50% of the time Report increased use in bathing and dressing and cooking.  Continue with home exercises 2-3 times a day Thumb palmar radial abduction pain free  After soft tissue lopes to  webspace Continue with tendon glides - need min v/c As well as opposition to all digits with thumb radial abduction in between  Reviewed with patient again active assisted range of motion for wrist flexion and extension -prayer stretch As well as active assisted range of motion for radial ulnar deviation as well as supination Reviewed with patient needed min to mod assist for 1 pound weight to do correctly for wrist and forearm in all planes 12 reps.  For 3 days.  Patient can increase to a second set in 3 to 4 days if no increase pain  Add light blue putty for gripping 2 times a day 20 reps pain-free     PATIENT EDUCATION: Education details: findings of eval and HEP  Person educated: Patient Education method: Explanation, Demonstration, Tactile cues, Verbal cues, and Handouts Education comprehension: verbalized understanding, returned demonstration, verbal cues required, and needs further education  Education comprehension: verbalized understanding, returned demonstration, verbal cues required, and needs further education   GOALS: Goals reviewed with patient? Yes   LONG TERM GOALS: Target date: 8 wks  Patient to be independent in home program to decrease edema and pain to increase thumb and digit range of motion to composite fist with no increase symptoms Baseline: MC flexion 75 to 90 degrees and PIP's 75 to 100 degrees with increased pain.  Increased tightness with extension of all digits with PIP extension -10 degrees, decreased palmar radial abduction of thumb with increased  pain.  No knowledge of home program. Goal status: INITIAL  2.  Patient to be independent in scar management including scar pad use and scar massage to increase adhesion and increased wrist flexion extension. Baseline: Patient still has 3 Steri-Strips on.  Scar adherent.  With decreased wrist flexion and extension at 30 and 44 degrees. Goal status: INITIAL  3.  Right forearm supination increased to Baseline:  Supination at 55 degrees with increased pain. Goal status: INITIAL  4.  Right wrist flexion extension improved to within functional limits for patient to be able to use hand and more than 75% of bathing and dressing without increased symptoms Baseline: Right wrist flexion 30 degrees and extension 44, ulnar deviation 10 degrees.  Using right hand less than 25% in ADLs. Goal status: INITI supination AL  5.  Right grip and prehension strength increased to more than 50% compared to the left for patient to initiate carrying groceries, hold glass, cut with a knife, hold a plate without increase symptoms Baseline: Grip and prehension not tested 3 weeks postop Goal status: INITIAL  6.  Patient to be independent in home program to increase shoulder and elbow active range of motion to within functional limits symptom-free. Baseline: Patient report and show increase stiffness and discomfort in right shoulder range of motion in all planes with internal rotation the worst.  And elbow extension. Goal status: INITIAL  ASSESSMENT:  CLINICAL IMPRESSION: Patient seen by  occupational therapy for right distal radius fracture with ORIF on 10/12/2023 by Dr. Mozell Arias.   Patient had orthopedic visit since last time.  X-ray showing good healing.  Patient continue to wear prefab wrist splint about 50% of the time.  Patient reports increased functional use in ADLs and cooking.  Patient showing great progress in wrist and forearm active range of motion.  Initiated 1 pound weight for wrist and forearm in all planes.  As well as light putty for gripping.  Reinforced with patient to keep pain under 2/10 especially with her wrist flexion stretches.  As well as to use modalities prior to range of motion to decrease pain increase motion.  Patient reports still continues to have shoulder pain since the fall. Pt  has orthopedic appointment for the shoulder coming up.  Patient is right-hand dominant. Pt did had yrs ago thumb fx - had pain  coming in but decrease during session- Patient is limited in functional use of right hand in ADLs and IADLs because of increased edema and pain, increase scar tissue and decreased motion and strength.  Patient can benefit from skilled OT services to decrease edema and scar tissue and pain and increase motion and strength and return to prior level of function.  PERFORMANCE DEFICITS: in functional skills including ADLs, IADLs, coordination, dexterity, proprioception, edema, ROM, strength, pain, flexibility, Fine motor control, decreased knowledge of precautions, decreased knowledge of use of DME, UE functional use, and scar tissue , cognitive skills including  IMPAIRMENTS: are limiting patient from ADLs, IADLs, rest and sleep, play, leisure, and social participation.   COMORBIDITIES: may have co-morbidities  that affects occupational performance. Patient will benefit from skilled OT to address above impairments and improve overall function.  MODIFICATION OR ASSISTANCE TO COMPLETE EVALUATION: Min-Moderate modification of tasks or assist with assess necessary to complete an evaluation.  OT OCCUPATIONAL PROFILE AND HISTORY: Detailed assessment: Review of records and additional review of physical, cognitive, psychosocial history related to current functional performance.  CLINICAL DECISION MAKING: Moderate - several treatment options, min-mod task modification  necessary  REHAB POTENTIAL: Good  EVALUATION COMPLEXITY: Moderate      PLAN:  OT FREQUENCY: 2x/week  OT DURATION: 8 weeks  PLANNED INTERVENTIONS: 97168 OT Re-evaluation, 97535 self care/ADL training, 16109 therapeutic exercise, 97530 therapeutic activity, 97112 neuromuscular re-education, 97140 manual therapy, 97018 paraffin, 60454 fluidotherapy, 97034 contrast bath, 97760 Orthotics management and training, scar mobilization, passive range of motion, patient/family education, and DME and/or AE instructions   CONSULTED AND AGREED WITH  PLAN OF CARE: Patient and friend     Heloise Lobo, OTR/L,CLT 11/25/2023, 10:36 AM

## 2023-11-29 ENCOUNTER — Encounter: Payer: PRIVATE HEALTH INSURANCE | Admitting: Occupational Therapy

## 2023-12-03 ENCOUNTER — Encounter: Payer: PRIVATE HEALTH INSURANCE | Admitting: Occupational Therapy

## 2023-12-03 ENCOUNTER — Ambulatory Visit: Payer: PRIVATE HEALTH INSURANCE | Admitting: Occupational Therapy

## 2023-12-03 DIAGNOSIS — M79641 Pain in right hand: Secondary | ICD-10-CM

## 2023-12-03 DIAGNOSIS — M25641 Stiffness of right hand, not elsewhere classified: Secondary | ICD-10-CM

## 2023-12-03 DIAGNOSIS — M6281 Muscle weakness (generalized): Secondary | ICD-10-CM

## 2023-12-03 DIAGNOSIS — R600 Localized edema: Secondary | ICD-10-CM

## 2023-12-03 DIAGNOSIS — M25531 Pain in right wrist: Secondary | ICD-10-CM

## 2023-12-03 DIAGNOSIS — M25631 Stiffness of right wrist, not elsewhere classified: Secondary | ICD-10-CM | POA: Diagnosis not present

## 2023-12-03 DIAGNOSIS — L905 Scar conditions and fibrosis of skin: Secondary | ICD-10-CM

## 2023-12-05 ENCOUNTER — Encounter: Payer: Self-pay | Admitting: Occupational Therapy

## 2023-12-05 NOTE — Therapy (Signed)
 OUTPATIENT OCCUPATIONAL THERAPY ORTHO TREATMENT  Patient Name: Anna Campbell MRN: [id number] DOB:1940/09/26, 83 y.o., female Today's Date: 12/05/2023  PCP: Dr Abel Hoe PROVIDER: Glendale Landmark PA  END OF SESSION:  OT End of Session - 12/05/23 1221     Visit Number 5    Number of Visits 16    Date for OT Re-Evaluation 12/30/23    OT Start Time 0901    OT Stop Time 0950    OT Time Calculation (min) 49 min    Activity Tolerance Patient tolerated treatment well    Behavior During Therapy [facility] for tasks assessed/performed             Past Medical History:  Diagnosis Date   Aortic atherosclerosis (HCC)    Hypercholesteremia    Hypertension    Osteoporosis    PVD (peripheral vascular disease) (HCC)    Wears dentures    partial  upper   Past Surgical History:  Procedure Laterality Date   ABDOMINAL HYSTERECTOMY     BIOPSY  04/06/2023   Procedure: BIOPSY;  Surgeon: Shane Darling, MD;  Location: [facility];  Service: Endoscopy;;   CHOLECYSTECTOMY     ESOPHAGOGASTRODUODENOSCOPY (EGD) WITH PROPOFOL  N/A 04/06/2023   Procedure: ESOPHAGOGASTRODUODENOSCOPY (EGD) WITH PROPOFOL ;  Surgeon: Shane Darling, MD;  Location: [facility];  Service: Endoscopy;  Laterality: N/A;  SPANISH INTERPRETER   JOINT REPLACEMENT     OPEN REDUCTION INTERNAL FIXATION (ORIF) DISTAL RADIAL FRACTURE Right 10/12/2023   Procedure: OPEN REDUCTION INTERNAL FIXATION (ORIF) DISTAL RADIUS FRACTURE;  Surgeon: Molli Angelucci, MD;  Location: [facility];  Service: Orthopedics;  Laterality: Right;   SCLEROTHERAPY  2024   Patient Active Problem List   Diagnosis Date Noted   Syncope 09/15/2021   Syncope and collapse 09/14/2021   AKI (acute kidney injury) (HCC) 09/14/2021   Anemia 09/14/2021   Aortic atherosclerosis (HCC) 02/08/2020   Essential hypertension 06/27/2019   Osteoporosis 06/27/2019   PVD (peripheral vascular disease) (HCC) 06/27/2019    ONSET DATE: 10/12/23  REFERRING DIAG: R  distal radius fx with ORIF  THERAPY DIAG:  Stiffness of right wrist, not elsewhere classified  Stiffness of right hand, not elsewhere classified  Pain in right wrist  Localized edema  Scar condition and fibrosis of skin  Muscle weakness (generalized)  Pain in right hand  Rationale for Evaluation and Treatment: Rehabilitation  SUBJECTIVE:   SUBJECTIVE STATEMENT: See in treatment note below Pt accompanied by: friend  PERTINENT HISTORY: Ortho note 10/27/23 examination of the right upper extremity shows mild swelling throughout the wrist and digits. Close to full composite fist. Full extension of the digits. She is tender along the distal radial metaphyseal region. Incision site appears well. Sutures removed, Steri-Strips applied. She has no significant swelling throughout the forearm.  Impression: S/P ORIF (open reduction internal fixation) fracture [Z98.890, Z87.81] S/P ORIF (open reduction internal fixation) fracture (primary encounter diagnosis)  Plan:  22. 83 year old female status post right distal radius ORIF. Having normal postoperative pain swelling and some stiffness. Patient will start naproxen 500 mg twice daily with food for 2 weeks. She will discontinue sling and work on shoulder and elbow range of motion as she reports mild soreness in these areas along with stiffness in her shoulder and elbow. We will refer to outpatient hand therapy. She will continue with Velcro wrist brace only remove when showering and working on gentle wrist range of motion exercises. She will wear the brace when she is sleeping. She will continue nonweightbearing on the  right upper extremity. Follow-up in 3 weeks for x-rays of the right wrist   PRECAUTIONS: Keep wrist brace on most all the time except when working on range of motion when sitting.  No weightbearing.  On right upper extremity    WEIGHT BEARING RESTRICTIONS: No weightbearing on right upper extremity  PAIN:  Are you having pain?   5/10 pain at right wrist and hand  FALLS: Has patient fallen in last 6 months? Yes. Number of falls 2  LIVING ENVIRONMENT: Lives with: lives alone  PLOF: Retired but was still independent -with basic ADLs as well as laundry and making her bed and cooking and homemaking activities.  Including driving per patient-had normal active range of motion and strength in right upper extremity  PATIENT GOALS: I want to get the swelling and the pain better in my motion and strength so that I can use it  NEXT MD VISIT: 11/10/2023  OBJECTIVE:  Note: Objective measures were completed at Evaluation unless otherwise noted.  HAND DOMINANCE: Right  ADLs: Patient using her right hand less than 25% in basic ADLs.  Unable to lift, grip, carry, pinch, pushing and pulling.  FUNCTIONAL OUTCOME MEASURES: Will assess next session  UPPER EXTREMITY ROM:     Active ROM Right eval Left eval R 11/09/23 R 11/23/23 R 11/25/23  Shoulder flexion       Shoulder abduction       Shoulder adduction       Shoulder extension       Shoulder internal rotation       Shoulder external rotation       Elbow flexion       Elbow extension       Wrist flexion 30  40 45 70  Wrist extension 44  45 54 63  Wrist ulnar deviation 10  20 20    Wrist radial deviation 20  30 30    Wrist pronation 90  90 90   Wrist supination 55  70 85   (Blank rows = not tested)  Active ROM Right eval Left eval R 11/09/23 R 11/23/23  Thumb MCP (0-60)      Thumb IP (0-80)      Thumb Radial abd/add (0-55) 40   48 50  Thumb Palmar abd/add (0-45) 45   58 55  Thumb Opposition to Small Finger    Opposition to 5th    Index MCP (0-90) 75   80 85  Index PIP (0-100) 75   90 95  Index DIP (0-70)        Long MCP (0-90)  80   85 90  Long PIP (0-100)  85   90 100  Long DIP (0-70)        Ring MCP (0-90)  80   85 90  Ring PIP (0-100)  100   100 100  Ring DIP (0-70)        Little MCP (0-90)  90   90 90  Little PIP (0-100)  90   90 95  Little DIP (0-70)         (Blank rows = not tested)    HAND FUNCTION: 4/15/25Grip strength: Right: 25 lbs; Left: 59 lbs, Lateral pinch: Right: 6 lbs, Left: 12 lbs, and 3 point pinch: Right: 6 lbs, Left: 11 lbs  4/25/25Grip strength: Right: 29 lbs; Left: 59 lbs, Lateral pinch: Right: 7 lbs, Left: 12 lbs, and 3 point pinch: Right:  7 lbs, Left: 11#  COORDINATION:  EVAL - Impaired because of  immobilization of the wrist as well as increased swelling and pain.  As well as stiffness in fingers and decreased flexion and endrange extension in all digits  SENSATION: Patient denies any sensory changes in the hand  EDEMA: Increase edema in right hand and wrist  COGNITION: Overall cognitive status: Within functional limits for tasks assessed  TREATMENT DATE: 12/03/23   Interpreter present during session:  Abel Abelson   Pt reports she is doing better, she received a shot in her shoulder yesterday for pain. She has been using 1# weight at home, still doing contrast. Reports fluidotherapy makes her hand feel better.    Continues to demonstrate great progress especially in flexion extension of the wrist.   Continued to remind patient to keep pain under 2/10 Pt to perform contrast prior to home exercises and stretches Table slides 20 reps for patient pain-free 2 times a day  Re-measured grip and prehension this date, see flow sheet for details.       Fluidotherapy:  Fluido therapy 8 min to decrease wrist discomfort and pain with wrist flexion, increase tissue mobility and improve ROM. Pt performing ROM to wrist and hand while in fluidotherapy.                                                                                           Manual Therapy:  Following fluidotherapy, pt seen for manual therapy skills performed by therapist for soft tissue massage, web space massage and carpal and metacarpal stretches to decrease pain, edema and improve motion.  Scar massage performed by therapist.   Therapeutic Exercises:   Tendon glides for 10 reps with occasional min v/c Opposition of thumb to all digits with thumb radial abduction in between Active assisted range of motion for wrist flexion and extension -prayer stretch Active assisted range of motion for radial ulnar deviation, supination Reviewed with patient needed min cues for proper form and technique for 1 pound weight for wrist and forearm in all planes 12 reps. Patient can increase to a third set in 3 to 4 days if no increase pain Table stretches for 10 reps.  Light blue putty for gripping 2 times a day 15 reps pain-free however pt reports this sometimes causes pain.  Recommend she decrease reps to 15 and make sure to perform after heat, ROM first.    Patient to cont with contrast 3 times a day if have increased edema Patient wearing her wrist splint about 50% of the time Report increased use in bathing and dressing and cooking. Continue with home exercises 2-3 times a day Thumb palmar radial abduction pain free    PATIENT EDUCATION: Education details: findings of eval and HEP  Person educated: Patient Education method: Explanation, Demonstration, Tactile cues, Verbal cues, and Handouts Education comprehension: verbalized understanding, returned demonstration, verbal cues required, and needs further education  Education comprehension: verbalized understanding, returned demonstration, verbal cues required, and needs further education   GOALS: Goals reviewed with patient? Yes   LONG TERM GOALS: Target date: 8 wks  Patient to be independent in home program to decrease edema and pain to increase thumb and digit range of  motion to composite fist with no increase symptoms Baseline: MC flexion 75 to 90 degrees and PIP's 75 to 100 degrees with increased pain.  Increased tightness with extension of all digits with PIP extension -10 degrees, decreased palmar radial abduction of thumb with increased pain.  No knowledge of home program. Goal status:  INITIAL  2.  Patient to be independent in scar management including scar pad use and scar massage to increase adhesion and increased wrist flexion extension. Baseline: Patient still has 3 Steri-Strips on.  Scar adherent.  With decreased wrist flexion and extension at 30 and 44 degrees. Goal status: INITIAL  3.  Right forearm supination increased to Baseline: Supination at 55 degrees with increased pain. Goal status: INITIAL  4.  Right wrist flexion extension improved to within functional limits for patient to be able to use hand and more than 75% of bathing and dressing without increased symptoms Baseline: Right wrist flexion 30 degrees and extension 44, ulnar deviation 10 degrees.  Using right hand less than 25% in ADLs. Goal status: INITI supination AL  5.  Right grip and prehension strength increased to more than 50% compared to the left for patient to initiate carrying groceries, hold glass, cut with a knife, hold a plate without increase symptoms Baseline: Grip and prehension not tested 3 weeks postop Goal status: INITIAL  6.  Patient to be independent in home program to increase shoulder and elbow active range of motion to within functional limits symptom-free. Baseline: Patient report and show increase stiffness and discomfort in right shoulder range of motion in all planes with internal rotation the worst.  And elbow extension. Goal status: INITIAL  ASSESSMENT:  CLINICAL IMPRESSION: Patient seen by  occupational therapy for right distal radius fracture with ORIF on 10/12/2023 by Dr. Mozell Arias.   Patient had orthopedic visit since last time.  X-ray showing good healing.  Patient continue to wear prefab wrist splint about 50% of the time.  Patient reports increased functional use in ADLs and cooking.  Patient showing great progress in wrist and forearm active range of motion. 1 pound weight for wrist and forearm in all planes and light putty for gripping.  Reinforced with patient to keep pain  under 2/10 especially with her wrist flexion stretches. Pt to use modalities prior to range of motion to decrease pain increase motion.  Pt with previous history of thumb fx - had pain coming in but decrease during session- Patient received shot in shoulder for pain yesterday, has not yet felt any change or relief.  Pt continues to progress well with wrist and hand, increased grip and pinches noted this date.  Issued additional [facility] for scar management.  Pt responds well to cues and use of pictures/instructions for HEP.  Patient is limited in functional use of right hand in ADLs and IADLs because of increased edema and pain, increase scar tissue and decreased motion and strength.  Patient can benefit from skilled OT services to decrease edema and scar tissue and pain and increase motion and strength and return to prior level of function.  PERFORMANCE DEFICITS: in functional skills including ADLs, IADLs, coordination, dexterity, proprioception, edema, ROM, strength, pain, flexibility, Fine motor control, decreased knowledge of precautions, decreased knowledge of use of DME, UE functional use, and scar tissue , cognitive skills including  IMPAIRMENTS: are limiting patient from ADLs, IADLs, rest and sleep, play, leisure, and social participation.   COMORBIDITIES: may have co-morbidities  that affects occupational performance. Patient will benefit from skilled  OT to address above impairments and improve overall function.  MODIFICATION OR ASSISTANCE TO COMPLETE EVALUATION: Min-Moderate modification of tasks or assist with assess necessary to complete an evaluation.  OT OCCUPATIONAL PROFILE AND HISTORY: Detailed assessment: Review of records and additional review of physical, cognitive, psychosocial history related to current functional performance.  CLINICAL DECISION MAKING: Moderate - several treatment options, min-mod task modification necessary  REHAB POTENTIAL: Good  EVALUATION COMPLEXITY:  Moderate   PLAN:  OT FREQUENCY: 2x/week  OT DURATION: 8 weeks  PLANNED INTERVENTIONS: 97168 OT Re-evaluation, 97535 self care/ADL training, 16109 therapeutic exercise, 97530 therapeutic activity, 97112 neuromuscular re-education, 97140 manual therapy, 97018 paraffin, 60454 fluidotherapy, 97034 contrast bath, 97760 Orthotics management and training, scar mobilization, passive range of motion, patient/family education, and DME and/or AE instructions  CONSULTED AND AGREED WITH PLAN OF CARE: Patient and friend  Rosan Comfort, OTR/L,CLT 12/05/2023, 12:22 PM

## 2023-12-06 ENCOUNTER — Encounter: Payer: PRIVATE HEALTH INSURANCE | Admitting: Occupational Therapy

## 2023-12-09 ENCOUNTER — Encounter: Payer: PRIVATE HEALTH INSURANCE | Admitting: Occupational Therapy

## 2023-12-10 ENCOUNTER — Encounter: Payer: PRIVATE HEALTH INSURANCE | Admitting: Occupational Therapy

## 2023-12-13 ENCOUNTER — Ambulatory Visit: Payer: PRIVATE HEALTH INSURANCE | Attending: Orthopedic Surgery | Admitting: Occupational Therapy

## 2023-12-13 DIAGNOSIS — M25531 Pain in right wrist: Secondary | ICD-10-CM | POA: Diagnosis present

## 2023-12-13 DIAGNOSIS — M79641 Pain in right hand: Secondary | ICD-10-CM | POA: Insufficient documentation

## 2023-12-13 DIAGNOSIS — L905 Scar conditions and fibrosis of skin: Secondary | ICD-10-CM | POA: Insufficient documentation

## 2023-12-13 DIAGNOSIS — M25631 Stiffness of right wrist, not elsewhere classified: Secondary | ICD-10-CM | POA: Insufficient documentation

## 2023-12-13 DIAGNOSIS — M6281 Muscle weakness (generalized): Secondary | ICD-10-CM | POA: Diagnosis present

## 2023-12-13 DIAGNOSIS — M25641 Stiffness of right hand, not elsewhere classified: Secondary | ICD-10-CM | POA: Diagnosis present

## 2023-12-13 DIAGNOSIS — R600 Localized edema: Secondary | ICD-10-CM | POA: Insufficient documentation

## 2023-12-16 ENCOUNTER — Encounter: Payer: Self-pay | Admitting: Occupational Therapy

## 2023-12-16 NOTE — Therapy (Signed)
 OUTPATIENT OCCUPATIONAL THERAPY ORTHO TREATMENT  Patient Name: Anna Campbell MRN: [id number] DOB:Dec 05, 1940, 83 y.o., female Today's Date: 12/16/2023  PCP: Dr Abel Hoe PROVIDER: Glendale Landmark PA  END OF SESSION:  OT End of Session - 12/16/23 1547     Visit Number 6    Number of Visits 16    Date for OT Re-Evaluation 12/30/23    OT Start Time 1030    OT Stop Time 1115    OT Time Calculation (min) 45 min    Activity Tolerance Patient tolerated treatment well    Behavior During Therapy WFL for tasks assessed/performed             Past Medical History:  Diagnosis Date   Aortic atherosclerosis (HCC)    Hypercholesteremia    Hypertension    Osteoporosis    PVD (peripheral vascular disease) (HCC)    Wears dentures    partial  upper   Past Surgical History:  Procedure Laterality Date   ABDOMINAL HYSTERECTOMY     BIOPSY  04/06/2023   Procedure: BIOPSY;  Surgeon: Shane Darling, MD;  Location: [facility];  Service: Endoscopy;;   CHOLECYSTECTOMY     ESOPHAGOGASTRODUODENOSCOPY (EGD) WITH PROPOFOL  N/A 04/06/2023   Procedure: ESOPHAGOGASTRODUODENOSCOPY (EGD) WITH PROPOFOL ;  Surgeon: Shane Darling, MD;  Location: [facility];  Service: Endoscopy;  Laterality: N/A;  SPANISH INTERPRETER   JOINT REPLACEMENT     OPEN REDUCTION INTERNAL FIXATION (ORIF) DISTAL RADIAL FRACTURE Right 10/12/2023   Procedure: OPEN REDUCTION INTERNAL FIXATION (ORIF) DISTAL RADIUS FRACTURE;  Surgeon: Molli Angelucci, MD;  Location: [facility];  Service: Orthopedics;  Laterality: Right;   SCLEROTHERAPY  2024   Patient Active Problem List   Diagnosis Date Noted   Syncope 09/15/2021   Syncope and collapse 09/14/2021   AKI (acute kidney injury) (HCC) 09/14/2021   Anemia 09/14/2021   Aortic atherosclerosis (HCC) 02/08/2020   Essential hypertension 06/27/2019   Osteoporosis 06/27/2019   PVD (peripheral vascular disease) (HCC) 06/27/2019    ONSET DATE: 10/12/23  REFERRING DIAG: R  distal radius fx with ORIF  THERAPY DIAG:  Stiffness of right wrist, not elsewhere classified  Pain in right wrist  Localized edema  Pain in right hand  Scar condition and fibrosis of skin  Stiffness of right hand, not elsewhere classified  Muscle weakness (generalized)  Rationale for Evaluation and Treatment: Rehabilitation  SUBJECTIVE:   SUBJECTIVE STATEMENT: See in treatment note below Pt accompanied by: friend  PERTINENT HISTORY: Ortho note 10/27/23 examination of the right upper extremity shows mild swelling throughout the wrist and digits. Close to full composite fist. Full extension of the digits. She is tender along the distal radial metaphyseal region. Incision site appears well. Sutures removed, Steri-Strips applied. She has no significant swelling throughout the forearm.  Impression: S/P ORIF (open reduction internal fixation) fracture [Z98.890, Z87.81] S/P ORIF (open reduction internal fixation) fracture (primary encounter diagnosis)  Plan:  29. 83 year old female status post right distal radius ORIF. Having normal postoperative pain swelling and some stiffness. Patient will start naproxen 500 mg twice daily with food for 2 weeks. She will discontinue sling and work on shoulder and elbow range of motion as she reports mild soreness in these areas along with stiffness in her shoulder and elbow. We will refer to outpatient hand therapy. She will continue with Velcro wrist brace only remove when showering and working on gentle wrist range of motion exercises. She will wear the brace when she is sleeping. She will continue nonweightbearing on the  right upper extremity. Follow-up in 3 weeks for x-rays of the right wrist   PRECAUTIONS: Keep wrist brace on most all the time except when working on range of motion when sitting.  No weightbearing.  On right upper extremity    WEIGHT BEARING RESTRICTIONS: No weightbearing on right upper extremity  PAIN:  Are you having pain?   5/10 pain at right wrist and hand  FALLS: Has patient fallen in last 6 months? Yes. Number of falls 2  LIVING ENVIRONMENT: Lives with: lives alone  PLOF: Retired but was still independent -with basic ADLs as well as laundry and making her bed and cooking and homemaking activities.  Including driving per patient-had normal active range of motion and strength in right upper extremity  PATIENT GOALS: I want to get the swelling and the pain better in my motion and strength so that I can use it  NEXT MD VISIT: 11/10/2023  OBJECTIVE:  Note: Objective measures were completed at Evaluation unless otherwise noted.  HAND DOMINANCE: Right  ADLs: Patient using her right hand less than 25% in basic ADLs.  Unable to lift, grip, carry, pinch, pushing and pulling.  FUNCTIONAL OUTCOME MEASURES: Will assess next session  UPPER EXTREMITY ROM:     Active ROM Right eval Left eval R 11/09/23 R 11/23/23 R 11/25/23 R 12/13/23  Shoulder flexion        Shoulder abduction        Shoulder adduction        Shoulder extension        Shoulder internal rotation        Shoulder external rotation        Elbow flexion        Elbow extension        Wrist flexion 30  40 45 70 70  Wrist extension 44  45 54 63 60  Wrist ulnar deviation 10  20 20     Wrist radial deviation 20  30 30     Wrist pronation 90  90 90    Wrist supination 55  70 85    (Blank rows = not tested)  Active ROM Right eval Left eval R 11/09/23 R 11/23/23  Thumb MCP (0-60)      Thumb IP (0-80)      Thumb Radial abd/add (0-55) 40   48 50  Thumb Palmar abd/add (0-45) 45   58 55  Thumb Opposition to Small Finger    Opposition to 5th    Index MCP (0-90) 75   80 85  Index PIP (0-100) 75   90 95  Index DIP (0-70)        Long MCP (0-90)  80   85 90  Long PIP (0-100)  85   90 100  Long DIP (0-70)        Ring MCP (0-90)  80   85 90  Ring PIP (0-100)  100   100 100  Ring DIP (0-70)        Little MCP (0-90)  90   90 90  Little PIP (0-100)  90    90 95  Little DIP (0-70)        (Blank rows = not tested)    HAND FUNCTION: 4/15/25Grip strength: Right: 25 lbs; Left: 59 lbs, Lateral pinch: Right: 6 lbs, Left: 12 lbs, and 3 point pinch: Right: 6 lbs, Left: 11 lbs  12/03/23 Grip strength: Right: 29 lbs; Left: 59 lbs, Lateral pinch: Right: 7 lbs, Left: 12 lbs, and  3 point pinch: Right:  7 lbs, Left: 11# 12/13/23 Grip strength: Right: 33 lbs; Left: 59 lbs, Lateral pinch: Right: 8 lbs, Left: 12 lbs, and 3 point pinch: Right:  8 lbs, Left: 11#  COORDINATION:  EVAL - Impaired because of immobilization of the wrist as well as increased swelling and pain.  As well as stiffness in fingers and decreased flexion and endrange extension in all digits  SENSATION: Patient denies any sensory changes in the hand  EDEMA: Increase edema in right hand and wrist  COGNITION: Overall cognitive status: Within functional limits for tasks assessed  TREATMENT DATE: 12/13/23   Interpreter present during session:  Lorita Rosa   Pt reports she continues to improve with her hand and wrist.  She may have some difficulty with transportation in the next few weeks for follow up appts.   Re-measured grip and prehension this date, see flow sheet for details.       Fluidotherapy:  Fluido therapy 8 min to decrease wrist discomfort and pain with wrist flexion, increase tissue mobility and improve ROM. Pt performing ROM to wrist and hand while in fluidotherapy.                                                                                           Manual Therapy:  Following fluidotherapy, pt seen for manual therapy skills performed by therapist for soft tissue massage, web space massage and carpal and metacarpal stretches to decrease pain, edema and improve motion.  Scar massage performed by therapist.   Therapeutic Exercises:  Tendon glides for 10 reps  Opposition of thumb to all digits with thumb radial abduction in between Active assisted range of motion  for wrist flexion and extension prayer stretch, occasional cues for proper form Active assisted range of motion for radial ulnar deviation, supination 1 pound weight for wrist and forearm in all planes 12 for 2 sets, attempted 2# for one set of 10 with some increased difficulty.  Can increase reps with 1# to 15-20 before transitioning to 2# weight. Table slides for 20 reps in standing    Patient to cont with contrast 2-3 times a day if have increased edema Reports continued increased use in bathing and dressing and cooking. Continue with home exercises 2-3 times a day Thumb palmar radial abduction pain free   Light blue putty for 3 sets of 10, Patient to keep pain under 2/10, will reassess increasing putty resistance next session. Table slides 20 reps for patient pain-free 2 times a day   PATIENT EDUCATION: Education details: findings of eval and HEP  Person educated: Patient Education method: Explanation, Demonstration, Tactile cues, Verbal cues, and Handouts Education comprehension: verbalized understanding, returned demonstration, verbal cues required, and needs further education  Education comprehension: verbalized understanding, returned demonstration, verbal cues required, and needs further education   GOALS: Goals reviewed with patient? Yes   LONG TERM GOALS: Target date: 8 wks  Patient to be independent in home program to decrease edema and pain to increase thumb and digit range of motion to composite fist with no increase symptoms Baseline: MC flexion 75 to 90 degrees and PIP's 75  to 100 degrees with increased pain.  Increased tightness with extension of all digits with PIP extension -10 degrees, decreased palmar radial abduction of thumb with increased pain.  No knowledge of home program. Goal status: INITIAL  2.  Patient to be independent in scar management including scar pad use and scar massage to increase adhesion and increased wrist flexion extension. Baseline: Patient  still has 3 Steri-Strips on.  Scar adherent.  With decreased wrist flexion and extension at 30 and 44 degrees. Goal status: INITIAL  3.  Right forearm supination increased to Baseline: Supination at 55 degrees with increased pain. Goal status: INITIAL  4.  Right wrist flexion extension improved to within functional limits for patient to be able to use hand and more than 75% of bathing and dressing without increased symptoms Baseline: Right wrist flexion 30 degrees and extension 44, ulnar deviation 10 degrees.  Using right hand less than 25% in ADLs. Goal status: INITI supination AL  5.  Right grip and prehension strength increased to more than 50% compared to the left for patient to initiate carrying groceries, hold glass, cut with a knife, hold a plate without increase symptoms Baseline: Grip and prehension not tested 3 weeks postop Goal status: INITIAL  6.  Patient to be independent in home program to increase shoulder and elbow active range of motion to within functional limits symptom-free. Baseline: Patient report and show increase stiffness and discomfort in right shoulder range of motion in all planes with internal rotation the worst.  And elbow extension. Goal status: INITIAL  ASSESSMENT:  CLINICAL IMPRESSION: Patient seen by  occupational therapy for right distal radius fracture with ORIF on 10/12/2023 by Dr. Mozell Arias.   Patient had orthopedic visit since last time.  X-ray showing good healing.  Pt continues to wear prefab splint when performing heavier tasks. Patient continues to demonstrate progress in wrist and forearm active range of motion. 1 pound weight for wrist and forearm in all planes and light putty for gripping.  Reinforced with patient to keep pain under 2/10 especially with her wrist flexion stretches. Pt to use modalities prior to range of motion to decrease pain increase motion.  Pt with previous history of thumb fx - had pain coming in but decrease during session. Pt  continues to progress well with wrist and hand, increased grip and pinches noted this date compared to last session, increase blue putty to 3 sets, will reassess putty resistance next session. Pt continues to work on supination of forearm towards end range, 82 degrees this date with pain increased to 6/10.  We tried 2# weight with wrist exercises however increased difficulty, one set of 10 reps with 2#, the remaining 2 sets with 1#, can increase sets with 1# and monitor pain before moving to 2# consistently.  Patient is limited in functional use of right hand in ADLs and IADLs because of increased edema and pain, increase scar tissue and decreased motion and strength.  Patient can benefit from skilled OT services to decrease edema and scar tissue and pain and increase motion and strength and return to prior level of function.  PERFORMANCE DEFICITS: in functional skills including ADLs, IADLs, coordination, dexterity, proprioception, edema, ROM, strength, pain, flexibility, Fine motor control, decreased knowledge of precautions, decreased knowledge of use of DME, UE functional use, and scar tissue, cognitive skills including  IMPAIRMENTS: are limiting patient from ADLs, IADLs, rest and sleep, play, leisure, and social participation.   COMORBIDITIES: may have co-morbidities  that affects occupational performance.  Patient will benefit from skilled OT to address above impairments and improve overall function.  MODIFICATION OR ASSISTANCE TO COMPLETE EVALUATION: Min-Moderate modification of tasks or assist with assess necessary to complete an evaluation.  OT OCCUPATIONAL PROFILE AND HISTORY: Detailed assessment: Review of records and additional review of physical, cognitive, psychosocial history related to current functional performance.  CLINICAL DECISION MAKING: Moderate - several treatment options, min-mod task modification necessary  REHAB POTENTIAL: Good  EVALUATION COMPLEXITY: Moderate   PLAN:  OT  FREQUENCY: 2x/week  OT DURATION: 8 weeks  PLANNED INTERVENTIONS: 97168 OT Re-evaluation, 97535 self care/ADL training, 16109 therapeutic exercise, 97530 therapeutic activity, 97112 neuromuscular re-education, 97140 manual therapy, 97018 paraffin, 60454 fluidotherapy, 97034 contrast bath, 97760 Orthotics management and training, scar mobilization, passive range of motion, patient/family education, and DME and/or AE instructions  CONSULTED AND AGREED WITH PLAN OF CARE: Patient and friend  Rosan Comfort, OTR/L,CLT 12/16/2023, 4:08 PM

## 2023-12-17 ENCOUNTER — Ambulatory Visit: Payer: PRIVATE HEALTH INSURANCE | Admitting: Occupational Therapy

## 2023-12-17 DIAGNOSIS — R600 Localized edema: Secondary | ICD-10-CM

## 2023-12-17 DIAGNOSIS — M6281 Muscle weakness (generalized): Secondary | ICD-10-CM

## 2023-12-17 DIAGNOSIS — M79641 Pain in right hand: Secondary | ICD-10-CM

## 2023-12-17 DIAGNOSIS — M25631 Stiffness of right wrist, not elsewhere classified: Secondary | ICD-10-CM

## 2023-12-17 DIAGNOSIS — M25531 Pain in right wrist: Secondary | ICD-10-CM

## 2023-12-17 DIAGNOSIS — M25641 Stiffness of right hand, not elsewhere classified: Secondary | ICD-10-CM

## 2023-12-17 DIAGNOSIS — L905 Scar conditions and fibrosis of skin: Secondary | ICD-10-CM

## 2023-12-17 NOTE — Therapy (Signed)
 OUTPATIENT OCCUPATIONAL THERAPY ORTHO TREATMENT  Patient Name: Anna Campbell MRN: [id number] DOB:May 10, 1941, 83 y.o., female Today's Date: 12/17/2023  PCP: Dr Abel Hoe PROVIDER: Glendale Landmark PA  END OF SESSION:  OT End of Session - 12/17/23 0950     Visit Number 7    Number of Visits 16    Date for OT Re-Evaluation 12/30/23    OT Start Time 0950    Activity Tolerance Patient tolerated treatment well    Behavior During Therapy [facility] for tasks assessed/performed             Past Medical History:  Diagnosis Date   Aortic atherosclerosis (HCC)    Hypercholesteremia    Hypertension    Osteoporosis    PVD (peripheral vascular disease) (HCC)    Wears dentures    partial  upper   Past Surgical History:  Procedure Laterality Date   ABDOMINAL HYSTERECTOMY     BIOPSY  04/06/2023   Procedure: BIOPSY;  Surgeon: Shane Darling, MD;  Location: [facility] ENDOSCOPY;  Service: Endoscopy;;   CHOLECYSTECTOMY     ESOPHAGOGASTRODUODENOSCOPY (EGD) WITH PROPOFOL  N/A 04/06/2023   Procedure: ESOPHAGOGASTRODUODENOSCOPY (EGD) WITH PROPOFOL ;  Surgeon: Shane Darling, MD;  Location: [facility] ENDOSCOPY;  Service: Endoscopy;  Laterality: N/A;  SPANISH INTERPRETER   JOINT REPLACEMENT     OPEN REDUCTION INTERNAL FIXATION (ORIF) DISTAL RADIAL FRACTURE Right 10/12/2023   Procedure: OPEN REDUCTION INTERNAL FIXATION (ORIF) DISTAL RADIUS FRACTURE;  Surgeon: Molli Angelucci, MD;  Location: [facility];  Service: Orthopedics;  Laterality: Right;   SCLEROTHERAPY  2024   Patient Active Problem List   Diagnosis Date Noted   Syncope 09/15/2021   Syncope and collapse 09/14/2021   AKI (acute kidney injury) (HCC) 09/14/2021   Anemia 09/14/2021   Aortic atherosclerosis (HCC) 02/08/2020   Essential hypertension 06/27/2019   Osteoporosis 06/27/2019   PVD (peripheral vascular disease) (HCC) 06/27/2019    ONSET DATE: 10/12/23  REFERRING DIAG: R distal radius fx with ORIF  THERAPY DIAG:  Stiffness of  right wrist, not elsewhere classified  Pain in right wrist  Localized edema  Pain in right hand  Scar condition and fibrosis of skin  Stiffness of right hand, not elsewhere classified  Muscle weakness (generalized)  Rationale for Evaluation and Treatment: Rehabilitation  SUBJECTIVE:   SUBJECTIVE STATEMENT: See in treatment note below Pt accompanied by: friend  PERTINENT HISTORY: Ortho note 10/27/23 examination of the right upper extremity shows mild swelling throughout the wrist and digits. Close to full composite fist. Full extension of the digits. She is tender along the distal radial metaphyseal region. Incision site appears well. Sutures removed, Steri-Strips applied. She has no significant swelling throughout the forearm.  Impression: S/P ORIF (open reduction internal fixation) fracture [Z98.890, Z87.81] S/P ORIF (open reduction internal fixation) fracture (primary encounter diagnosis)  Plan:  63. 83 year old female status post right distal radius ORIF. Having normal postoperative pain swelling and some stiffness. Patient will start naproxen 500 mg twice daily with food for 2 weeks. She will discontinue sling and work on shoulder and elbow range of motion as she reports mild soreness in these areas along with stiffness in her shoulder and elbow. We will refer to outpatient hand therapy. She will continue with Velcro wrist brace only remove when showering and working on gentle wrist range of motion exercises. She will wear the brace when she is sleeping. She will continue nonweightbearing on the right upper extremity. Follow-up in 3 weeks for x-rays of the right wrist   PRECAUTIONS:  Keep wrist brace on most all the time except when working on range of motion when sitting.  No weightbearing.  On right upper extremity    WEIGHT BEARING RESTRICTIONS: No weightbearing on right upper extremity  PAIN:  Are you having pain?  5/10 pain at right wrist and hand  FALLS: Has patient  fallen in last 6 months? Yes. Number of falls 2  LIVING ENVIRONMENT: Lives with: lives alone  PLOF: Retired but was still independent -with basic ADLs as well as laundry and making her bed and cooking and homemaking activities.  Including driving per patient-had normal active range of motion and strength in right upper extremity  PATIENT GOALS: I want to get the swelling and the pain better in my motion and strength so that I can use it  NEXT MD VISIT: 11/10/2023  OBJECTIVE:  Note: Objective measures were completed at Evaluation unless otherwise noted.  HAND DOMINANCE: Right  ADLs: Patient using her right hand less than 25% in basic ADLs.  Unable to lift, grip, carry, pinch, pushing and pulling.  FUNCTIONAL OUTCOME MEASURES: Will assess next session  UPPER EXTREMITY ROM:     Active ROM Right eval Left eval R 11/09/23 R 11/23/23 R 11/25/23 R 12/13/23 R 12/17/23  Shoulder flexion         Shoulder abduction         Shoulder adduction         Shoulder extension         Shoulder internal rotation         Shoulder external rotation         Elbow flexion         Elbow extension         Wrist flexion 30  40 45 70 70 60 end of session 75  Wrist extension 44  45 54 63 60 60 end of session 70  Wrist ulnar deviation 10  20 20      Wrist radial deviation 20  30 30      Wrist pronation 90  90 90     Wrist supination 55  70 85     (Blank rows = not tested)  Active ROM Right eval Left eval R 11/09/23 R 11/23/23  Thumb MCP (0-60)      Thumb IP (0-80)      Thumb Radial abd/add (0-55) 40   48 50  Thumb Palmar abd/add (0-45) 45   58 55  Thumb Opposition to Small Finger    Opposition to 5th    Index MCP (0-90) 75   80 85  Index PIP (0-100) 75   90 95  Index DIP (0-70)        Long MCP (0-90)  80   85 90  Long PIP (0-100)  85   90 100  Long DIP (0-70)        Ring MCP (0-90)  80   85 90  Ring PIP (0-100)  100   100 100  Ring DIP (0-70)        Little MCP (0-90)  90   90 90  Little PIP  (0-100)  90   90 95  Little DIP (0-70)        (Blank rows = not tested)    HAND FUNCTION: 4/15/25Grip strength: Right: 25 lbs; Left: 59 lbs, Lateral pinch: Right: 6 lbs, Left: 12 lbs, and 3 point pinch: Right: 6 lbs, Left: 11 lbs  12/03/23 Grip strength: Right: 29 lbs; Left: 59 lbs, Lateral  pinch: Right: 7 lbs, Left: 12 lbs, and 3 point pinch: Right:  7 lbs, Left: 11# 12/13/23 Grip strength: Right: 33 lbs; Left: 59 lbs, Lateral pinch: Right: 8 lbs, Left: 12 lbs, and 3 point pinch: Right:  8 lbs, Left: 11#  COORDINATION:  EVAL - Impaired because of immobilization of the wrist as well as increased swelling and pain.  As well as stiffness in fingers and decreased flexion and endrange extension in all digits  SENSATION: Patient denies any sensory changes in the hand  EDEMA: Increase edema in right hand and wrist  COGNITION: Overall cognitive status: Within functional limits for tasks assessed  TREATMENT DATE: 12/17/23   Interpreter present during session   Patient arrived with reports of increased functional use of right hand and making the bed, doing laundry, cooking, doing groceries-patient continues to have discomfort with pushing a door, turning a doorknob and carrying lifting more than half a gallon.  Simulate some functional activities with patient for pushing and pulling door, pushing up from chair, simulate pouring a drink.  As well as carrying groceries. Patient was able to carry on lift 4 to 5 pounds.  But increase difficulty with picking up compensating with shoulder abduction and external rotation.  Increased heaviness with more than 5 pounds.  Patient had some discomfort with rotation and supination as well as wrist extension pushing a door. Reinforced with patient again that she has limited wrist flexion extension need more motion to decrease discomfort and pain. Strength improving.      Fluidotherapy:  Fluido therapy 8 min  Prior to gentle passive range of motion for  wrist flexion extension.  Reviewed with patient to do at home using heating pad for wrist flexion 2 minutes followed by extension 2 minutes and repeat again both total of 8 minutes prior to                                                                                           Therapeutic Exercises:  Active assisted range of motion for wrist flexion extension over the armrest.   Followed by prayer stretch  Followed by table slides 20 reps for wrist extension.   Showed great progress.   Patient can upgrade to 2 pounds for all wrist and forearm motions including flexion extension of the wrist 12 reps 2 times a day  Patient can do 2 pounds or 3 pounds for scapular squeezes, bicep curls and shoulder abduction pain-free  Once a day 12 reps       Patient reports still doing light blue putty for gripping.  OT attempted to upgrade but patient report increased swelling in the ring finger at times after the putty.  Will continue to monitor progress  PATIENT EDUCATION: Education details: findings of eval and HEP  Person educated: Patient Education method: Explanation, Demonstration, Tactile cues, Verbal cues, and Handouts Education comprehension: verbalized understanding, returned demonstration, verbal cues required, and needs further education  Education comprehension: verbalized understanding, returned demonstration, verbal cues required, and needs further education   GOALS: Goals reviewed with patient? Yes   LONG TERM GOALS: Target date: 8 wks  Patient to be  independent in home program to decrease edema and pain to increase thumb and digit range of motion to composite fist with no increase symptoms Baseline: MC flexion 75 to 90 degrees and PIP's 75 to 100 degrees with increased pain.  Increased tightness with extension of all digits with PIP extension -10 degrees, decreased palmar radial abduction of thumb with increased pain.  No knowledge of home program. Goal status: INITIAL  2.  Patient  to be independent in scar management including scar pad use and scar massage to increase adhesion and increased wrist flexion extension. Baseline: Patient still has 3 Steri-Strips on.  Scar adherent.  With decreased wrist flexion and extension at 30 and 44 degrees. Goal status: INITIAL  3.  Right forearm supination increased to Baseline: Supination at 55 degrees with increased pain. Goal status: INITIAL  4.  Right wrist flexion extension improved to within functional limits for patient to be able to use hand and more than 75% of bathing and dressing without increased symptoms Baseline: Right wrist flexion 30 degrees and extension 44, ulnar deviation 10 degrees.  Using right hand less than 25% in ADLs. Goal status: INITI supination AL  5.  Right grip and prehension strength increased to more than 50% compared to the left for patient to initiate carrying groceries, hold glass, cut with a knife, hold a plate without increase symptoms Baseline: Grip and prehension not tested 3 weeks postop Goal status: INITIAL  6.  Patient to be independent in home program to increase shoulder and elbow active range of motion to within functional limits symptom-free. Baseline: Patient report and show increase stiffness and discomfort in right shoulder range of motion in all planes with internal rotation the worst.  And elbow extension. Goal status: INITIAL  ASSESSMENT:  CLINICAL IMPRESSION: Patient seen by  occupational therapy for right distal radius fracture with ORIF on 10/12/2023 by Dr. Mozell Arias.   Patient had orthopedic visit since last time.  X-ray showing good healing.  Patient reports increased functional use and ADLs and IADLs in the home.  But still discomfort and pain with pushing a door, pushing up and rotation.  Patient continued to be limited in wrist flexion and extension.  Reviewed and reinforced with patient the importance of working on motion for wrist flexion extension after heat to increase flexion  extension and then 2 pound weight.  Patient showed great progress in session reviewed and upgraded patient's home program.  Patient is limited in functional use of right hand in ADLs and IADLs because of increased pain, iand decreased motion and strength.  Patient can benefit from skilled OT services to decrease edema and scar tissue and pain and increase motion and strength and return to prior level of function.  PERFORMANCE DEFICITS: in functional skills including ADLs, IADLs, coordination, dexterity, proprioception, edema, ROM, strength, pain, flexibility, Fine motor control, decreased knowledge of precautions, decreased knowledge of use of DME, UE functional use, and scar tissue, cognitive skills including  IMPAIRMENTS: are limiting patient from ADLs, IADLs, rest and sleep, play, leisure, and social participation.   COMORBIDITIES: may have co-morbidities  that affects occupational performance. Patient will benefit from skilled OT to address above impairments and improve overall function.  MODIFICATION OR ASSISTANCE TO COMPLETE EVALUATION: Min-Moderate modification of tasks or assist with assess necessary to complete an evaluation.  OT OCCUPATIONAL PROFILE AND HISTORY: Detailed assessment: Review of records and additional review of physical, cognitive, psychosocial history related to current functional performance.  CLINICAL DECISION MAKING: Moderate - several treatment options,  min-mod task modification necessary  REHAB POTENTIAL: Good  EVALUATION COMPLEXITY: Moderate   PLAN:  OT FREQUENCY: 2x/week  OT DURATION: 8 weeks  PLANNED INTERVENTIONS: 97168 OT Re-evaluation, 97535 self care/ADL training, 16109 therapeutic exercise, 97530 therapeutic activity, 97112 neuromuscular re-education, 97140 manual therapy, 97018 paraffin, 60454 fluidotherapy, 97034 contrast bath, 97760 Orthotics management and training, scar mobilization, passive range of motion, patient/family education, and DME and/or  AE instructions  CONSULTED AND AGREED WITH PLAN OF CARE: Patient and friend  Heloise Lobo, OTR/L,CLT 12/17/2023, 9:51 AM

## 2023-12-20 ENCOUNTER — Ambulatory Visit: Payer: PRIVATE HEALTH INSURANCE | Admitting: Occupational Therapy

## 2023-12-23 ENCOUNTER — Ambulatory Visit: Payer: PRIVATE HEALTH INSURANCE | Admitting: Occupational Therapy

## 2023-12-23 DIAGNOSIS — M25531 Pain in right wrist: Secondary | ICD-10-CM

## 2023-12-23 DIAGNOSIS — M25631 Stiffness of right wrist, not elsewhere classified: Secondary | ICD-10-CM

## 2023-12-23 DIAGNOSIS — R600 Localized edema: Secondary | ICD-10-CM

## 2023-12-23 DIAGNOSIS — M6281 Muscle weakness (generalized): Secondary | ICD-10-CM

## 2023-12-23 DIAGNOSIS — M79641 Pain in right hand: Secondary | ICD-10-CM

## 2023-12-23 DIAGNOSIS — L905 Scar conditions and fibrosis of skin: Secondary | ICD-10-CM

## 2023-12-23 NOTE — Therapy (Signed)
 OUTPATIENT OCCUPATIONAL THERAPY ORTHO TREATMENT  Patient Name: Anna Campbell MRN: [id number] DOB:[birth year], 83 y.o., female Today's Date: 12/23/2023  PCP: Dr Abel Hoe PROVIDER: Glendale Landmark PA  END OF SESSION:  OT End of Session - 12/23/23 0949     Visit Number 8    Number of Visits 16    Date for OT Re-Evaluation 12/30/23    OT Start Time 0949    Activity Tolerance Patient tolerated treatment well    Behavior During Therapy [facility] for tasks assessed/performed             Past Medical History:  Diagnosis Date   Aortic atherosclerosis (HCC)    Hypercholesteremia    Hypertension    Osteoporosis    PVD (peripheral vascular disease) (HCC)    Wears dentures    partial  upper   Past Surgical History:  Procedure Laterality Date   ABDOMINAL HYSTERECTOMY     BIOPSY  04/06/2023   Procedure: BIOPSY;  Surgeon: Shane Darling, MD;  Location: [facility];  Service: Endoscopy;;   CHOLECYSTECTOMY     ESOPHAGOGASTRODUODENOSCOPY (EGD) WITH PROPOFOL  N/A 04/06/2023   Procedure: ESOPHAGOGASTRODUODENOSCOPY (EGD) WITH PROPOFOL ;  Surgeon: Shane Darling, MD;  Location: [facility];  Service: Endoscopy;  Laterality: N/A;  SPANISH INTERPRETER   JOINT REPLACEMENT     OPEN REDUCTION INTERNAL FIXATION (ORIF) DISTAL RADIAL FRACTURE Right 10/12/2023   Procedure: OPEN REDUCTION INTERNAL FIXATION (ORIF) DISTAL RADIUS FRACTURE;  Surgeon: Molli Angelucci, MD;  Location: [facility];  Service: Orthopedics;  Laterality: Right;   SCLEROTHERAPY  2024   Patient Active Problem List   Diagnosis Date Noted   Syncope 09/15/2021   Syncope and collapse 09/14/2021   AKI (acute kidney injury) (HCC) 09/14/2021   Anemia 09/14/2021   Aortic atherosclerosis (HCC) 02/08/2020   Essential hypertension 06/27/2019   Osteoporosis 06/27/2019   PVD (peripheral vascular disease) (HCC) 06/27/2019    ONSET DATE: 10/12/23  REFERRING DIAG: R distal radius fx with ORIF  THERAPY DIAG:  Stiffness of  right wrist, not elsewhere classified  Pain in right wrist  Localized edema  Pain in right hand  Scar condition and fibrosis of skin  Rationale for Evaluation and Treatment: Rehabilitation  SUBJECTIVE:   SUBJECTIVE STATEMENT: See in treatment note below Pt accompanied by: friend  PERTINENT HISTORY: Ortho note 10/27/23 examination of the right upper extremity shows mild swelling throughout the wrist and digits. Close to full composite fist. Full extension of the digits. She is tender along the distal radial metaphyseal region. Incision site appears well. Sutures removed, Steri-Strips applied. She has no significant swelling throughout the forearm.  Impression: S/P ORIF (open reduction internal fixation) fracture [Z98.890, Z87.81] S/P ORIF (open reduction internal fixation) fracture (primary encounter diagnosis)  Plan:  37. 83 year old female status post right distal radius ORIF. Having normal postoperative pain swelling and some stiffness. Patient will start naproxen 500 mg twice daily with food for 2 weeks. She will discontinue sling and work on shoulder and elbow range of motion as she reports mild soreness in these areas along with stiffness in her shoulder and elbow. We will refer to outpatient hand therapy. She will continue with Velcro wrist brace only remove when showering and working on gentle wrist range of motion exercises. She will wear the brace when she is sleeping. She will continue nonweightbearing on the right upper extremity. Follow-up in 3 weeks for x-rays of the right wrist   PRECAUTIONS: Keep wrist brace on most all the time except when working on  range of motion when sitting.  No weightbearing.  On right upper extremity    WEIGHT BEARING RESTRICTIONS: No weightbearing on right upper extremity  PAIN:  Are you having pain?  5/10 pain at right wrist and hand  FALLS: Has patient fallen in last 6 months? Yes. Number of falls 2  LIVING ENVIRONMENT: Lives with: lives  alone  PLOF: Retired but was still independent -with basic ADLs as well as laundry and making her bed and cooking and homemaking activities.  Including driving per patient-had normal active range of motion and strength in right upper extremity  PATIENT GOALS: I want to get the swelling and the pain better in my motion and strength so that I can use it  NEXT MD VISIT: 11/10/2023  OBJECTIVE:  Note: Objective measures were completed at Evaluation unless otherwise noted.  HAND DOMINANCE: Right  ADLs: Patient using her right hand less than 25% in basic ADLs.  Unable to lift, grip, carry, pinch, pushing and pulling.  FUNCTIONAL OUTCOME MEASURES: Will assess next session  UPPER EXTREMITY ROM:     Active ROM Right eval Left eval R 11/09/23 R 11/23/23 R 11/25/23 R 12/13/23 R 12/17/23 R 12/23/23  Shoulder flexion          Shoulder abduction          Shoulder adduction          Shoulder extension          Shoulder internal rotation          Shoulder external rotation          Elbow flexion          Elbow extension          Wrist flexion 30  40 45 70 70 60 end of session 75 75 insession 85  Wrist extension 44  45 54 63 60 60 end of session 70 68  Wrist ulnar deviation 10  20 20       Wrist radial deviation 20  30 30       Wrist pronation 90  90 90      Wrist supination 55  70 85      (Blank rows = not tested)  Active ROM Right eval Left eval R 11/09/23 R 11/23/23  Thumb MCP (0-60)      Thumb IP (0-80)      Thumb Radial abd/add (0-55) 40   48 50  Thumb Palmar abd/add (0-45) 45   58 55  Thumb Opposition to Small Finger    Opposition to 5th    Index MCP (0-90) 75   80 85  Index PIP (0-100) 75   90 95  Index DIP (0-70)        Long MCP (0-90)  80   85 90  Long PIP (0-100)  85   90 100  Long DIP (0-70)        Ring MCP (0-90)  80   85 90  Ring PIP (0-100)  100   100 100  Ring DIP (0-70)        Little MCP (0-90)  90   90 90  Little PIP (0-100)  90   90 95  Little DIP (0-70)         (Blank rows = not tested)    HAND FUNCTION: 4/15/25Grip strength: Right: 25 lbs; Left: 59 lbs, Lateral pinch: Right: 6 lbs, Left: 12 lbs, and 3 point pinch: Right: 6 lbs, Left: 11 lbs  12/03/23 Grip strength: Right:  29 lbs; Left: 59 lbs, Lateral pinch: Right: 7 lbs, Left: 12 lbs, and 3 point pinch: Right:  7 lbs, Left: 11# 12/13/23 Grip strength: Right: 33 lbs; Left: 59 lbs, Lateral pinch: Right: 8 lbs, Left: 12 lbs, and 3 point pinch: Right:  8 lbs, Left: 11# 12/23/23 Grip strength: Right: 40 lbs; Left: 59 lbs, Lateral pinch: Right: 9 lbs, Left: 12 lbs, and 3 point pinch: Right:  10 lbs, Left: 11#  COORDINATION:  EVAL - Impaired because of immobilization of the wrist as well as increased swelling and pain.  As well as stiffness in fingers and decreased flexion and endrange extension in all digits  SENSATION: Patient denies any sensory changes in the hand  EDEMA: Increase edema in right hand and wrist  COGNITION: Overall cognitive status: Within functional limits for tasks assessed  TREATMENT DATE: 12/23/23   Interpreter present during session Patient arrived after doing home program for about a week focusing on moist heat prior to right wrist flexion extension stretches, table slides. Patient showed great progress in right wrist flexion extension as well as grip and prehension strength.  See flowsheet   Patient continues to report increased functional use of right hand and making the bed, doing laundry, cooking, doing groceries-patient continues to have discomfort with pushing a door, pushing up from a chair, turning a doorknob and carrying lifting more than half a gallon.  Last visit patient was able to carry on lift 4 to 5 pounds.  But increase difficulty with picking up compensating with shoulder abduction and external rotation.  Increased heaviness with more than 5 pounds.  Patient had some discomfort with rotation and supination as well as wrist extension pushing a door.  As  well as pushing up from a chair still discomfort. Reinforced with patient again that she has limited wrist flexion /extension need more motion to decrease discomfort and pain. Strength improving.      Fluidotherapy:  Fluido therapy 8 min  Prior to gentle passive range of motion for wrist flexion extension.  Reviewed with patient to do at home using heating pad for wrist flexion 2 minutes followed by extension 2 minutes and repeat again both total of 8 minutes prior to                                                                                          Had to reinforce with patient again using the heat for wrist flexion extension Followed by Therapeutic Exercises:  Active assisted range of motion for wrist flexion extension over the armrest.   Followed by prayer stretch  Followed by table slides 20 reps for wrist extension.  Had to reinforce and reviewed with patient again mod verbal cueing Showed great progress.   Followed by wall push-ups.  10 reps can add to home program Patient can continue with 2 pounds for all wrist and forearm motions including flexion extension of the wrist 12 reps 2 times a day  Patient can do 2 pounds or 3 pounds for scapular squeezes, bicep curls and shoulder abduction pain-free  Once a day 12 reps       Was able to  upgrade patient to medium teal putty for gripping-but reviewed with patient to do only 2 times a day for 15 reps pain-free.  Patient admitted that she overdone and done it more reps and sets during the day with a light blue putty causing some irritation and discomfort at digits.     PATIENT EDUCATION: Education details: findings of eval and HEP  Person educated: Patient Education method: Explanation, Demonstration, Tactile cues, Verbal cues, and Handouts Education comprehension: verbalized understanding, returned demonstration, verbal cues required, and needs further education  Education comprehension: verbalized understanding, returned  demonstration, verbal cues required, and needs further education   GOALS: Goals reviewed with patient? Yes   LONG TERM GOALS: Target date: 8 wks  Patient to be independent in home program to decrease edema and pain to increase thumb and digit range of motion to composite fist with no increase symptoms Baseline: MC flexion 75 to 90 degrees and PIP's 75 to 100 degrees with increased pain.  Increased tightness with extension of all digits with PIP extension -10 degrees, decreased palmar radial abduction of thumb with increased pain.  No knowledge of home program. Goal status: INITIAL  2.  Patient to be independent in scar management including scar pad use and scar massage to increase adhesion and increased wrist flexion extension. Baseline: Patient still has 3 Steri-Strips on.  Scar adherent.  With decreased wrist flexion and extension at 30 and 44 degrees. Goal status: INITIAL  3.  Right forearm supination increased to Baseline: Supination at 55 degrees with increased pain. Goal status: INITIAL  4.  Right wrist flexion extension improved to within functional limits for patient to be able to use hand and more than 75% of bathing and dressing without increased symptoms Baseline: Right wrist flexion 30 degrees and extension 44, ulnar deviation 10 degrees.  Using right hand less than 25% in ADLs. Goal status: INITI supination AL  5.  Right grip and prehension strength increased to more than 50% compared to the left for patient to initiate carrying groceries, hold glass, cut with a knife, hold a plate without increase symptoms Baseline: Grip and prehension not tested 3 weeks postop Goal status: INITIAL  6.  Patient to be independent in home program to increase shoulder and elbow active range of motion to within functional limits symptom-free. Baseline: Patient report and show increase stiffness and discomfort in right shoulder range of motion in all planes with internal rotation the worst.   And elbow extension. Goal status: INITIAL  ASSESSMENT:  CLINICAL IMPRESSION: Patient seen by  occupational therapy for right distal radius fracture with ORIF on 10/12/2023 by Dr. Mozell Arias.   Patient had orthopedic visit since last time.  X-ray showing good healing.  Patient reports increased functional use and ADLs and IADLs in the home.  Last session patient had  discomfort and pain with pushing a door, pushing up and rotation.  Patient was limited in wrist flexion and extension.  But this past week with home program focusing on heat with wrist flexion extension stretches patient made great progress and then 2 pound weight.  Patient made great progress last week and wrist flexion extension as well as grip and prehension strength.  Reinforced with patient again to continue with heat and stretches as well as was able to upgrade her to medium teal putty for gripping.  But reinforced with patient to not overdo and keep it pain-free.  Patient to follow-up with me in 2 weeks.  Patient is limited in functional use of right  hand in ADLs and IADLs because of increased pain, iand decreased motion and strength.  Patient can benefit from skilled OT services to decrease edema and scar tissue and pain and increase motion and strength and return to prior level of function.  PERFORMANCE DEFICITS: in functional skills including ADLs, IADLs, coordination, dexterity, proprioception, edema, ROM, strength, pain, flexibility, Fine motor control, decreased knowledge of precautions, decreased knowledge of use of DME, UE functional use, and scar tissue, cognitive skills including  IMPAIRMENTS: are limiting patient from ADLs, IADLs, rest and sleep, play, leisure, and social participation.   COMORBIDITIES: may have co-morbidities  that affects occupational performance. Patient will benefit from skilled OT to address above impairments and improve overall function.  MODIFICATION OR ASSISTANCE TO COMPLETE EVALUATION: Min-Moderate  modification of tasks or assist with assess necessary to complete an evaluation.  OT OCCUPATIONAL PROFILE AND HISTORY: Detailed assessment: Review of records and additional review of physical, cognitive, psychosocial history related to current functional performance.  CLINICAL DECISION MAKING: Moderate - several treatment options, min-mod task modification necessary  REHAB POTENTIAL: Good  EVALUATION COMPLEXITY: Moderate   PLAN:  OT FREQUENCY: 2x/week  OT DURATION: 8 weeks  PLANNED INTERVENTIONS: 97168 OT Re-evaluation, 97535 self care/ADL training, 16109 therapeutic exercise, 97530 therapeutic activity, 97112 neuromuscular re-education, 97140 manual therapy, 97018 paraffin, 60454 fluidotherapy, 97034 contrast bath, 97760 Orthotics management and training, scar mobilization, passive range of motion, patient/family education, and DME and/or AE instructions  CONSULTED AND AGREED WITH PLAN OF CARE: Patient and friend  Heloise Lobo, OTR/L,CLT 12/23/2023, 9:49 AM

## 2023-12-27 ENCOUNTER — Ambulatory Visit: Payer: PRIVATE HEALTH INSURANCE | Admitting: Occupational Therapy

## 2023-12-30 ENCOUNTER — Ambulatory Visit: Payer: PRIVATE HEALTH INSURANCE | Admitting: Occupational Therapy

## 2024-01-04 ENCOUNTER — Encounter: Payer: PRIVATE HEALTH INSURANCE | Admitting: Occupational Therapy

## 2024-01-06 ENCOUNTER — Ambulatory Visit: Payer: PRIVATE HEALTH INSURANCE | Admitting: Occupational Therapy

## 2024-01-06 DIAGNOSIS — M25531 Pain in right wrist: Secondary | ICD-10-CM

## 2024-01-06 DIAGNOSIS — R600 Localized edema: Secondary | ICD-10-CM

## 2024-01-06 DIAGNOSIS — M25631 Stiffness of right wrist, not elsewhere classified: Secondary | ICD-10-CM

## 2024-01-06 DIAGNOSIS — M6281 Muscle weakness (generalized): Secondary | ICD-10-CM

## 2024-01-06 NOTE — Therapy (Addendum)
 OUTPATIENT OCCUPATIONAL THERAPY ORTHO TREATMENT/dicharge  Patient Name: Anna Campbell MRN: [id number] DOB:[birth year], 83 y.o., female Today's Date: 01/06/2024  PCP: Dr Abel Hoe PROVIDER: Glendale Landmark PA  END OF SESSION:  OT End of Session - 01/06/24 0932     Visit Number 9    Number of Visits 16    Date for OT Re-Evaluation 12/30/23    OT Start Time 0945    Activity Tolerance Patient tolerated treatment well    Behavior During Therapy [facility] for tasks assessed/performed             Past Medical History:  Diagnosis Date   Aortic atherosclerosis (HCC)    Hypercholesteremia    Hypertension    Osteoporosis    PVD (peripheral vascular disease) (HCC)    Wears dentures    partial  upper   Past Surgical History:  Procedure Laterality Date   ABDOMINAL HYSTERECTOMY     BIOPSY  04/06/2023   Procedure: BIOPSY;  Surgeon: Shane Darling, MD;  Location: [facility] ENDOSCOPY;  Service: Endoscopy;;   CHOLECYSTECTOMY     ESOPHAGOGASTRODUODENOSCOPY (EGD) WITH PROPOFOL  N/A 04/06/2023   Procedure: ESOPHAGOGASTRODUODENOSCOPY (EGD) WITH PROPOFOL ;  Surgeon: Shane Darling, MD;  Location: [facility] ENDOSCOPY;  Service: Endoscopy;  Laterality: N/A;  SPANISH INTERPRETER   JOINT REPLACEMENT     OPEN REDUCTION INTERNAL FIXATION (ORIF) DISTAL RADIAL FRACTURE Right 10/12/2023   Procedure: OPEN REDUCTION INTERNAL FIXATION (ORIF) DISTAL RADIUS FRACTURE;  Surgeon: Molli Angelucci, MD;  Location: [facility];  Service: Orthopedics;  Laterality: Right;   SCLEROTHERAPY  2024   Patient Active Problem List   Diagnosis Date Noted   Syncope 09/15/2021   Syncope and collapse 09/14/2021   AKI (acute kidney injury) (HCC) 09/14/2021   Anemia 09/14/2021   Aortic atherosclerosis (HCC) 02/08/2020   Essential hypertension 06/27/2019   Osteoporosis 06/27/2019   PVD (peripheral vascular disease) (HCC) 06/27/2019    ONSET DATE: 10/12/23  REFERRING DIAG: R distal radius fx with ORIF  THERAPY DIAG:   Stiffness of right wrist, not elsewhere classified  Pain in right wrist  Localized edema  Muscle weakness (generalized)  Rationale for Evaluation and Treatment: Rehabilitation  SUBJECTIVE:   SUBJECTIVE STATEMENT: I am doing everything with my right hand.  I can cook, I can do laundry and make my bed.  I done my stretching exercises.  And I am doing the 2 pound weight in the putty Pt accompanied by: friend  PERTINENT HISTORY: Ortho note 10/27/23 examination of the right upper extremity shows mild swelling throughout the wrist and digits. Close to full composite fist. Full extension of the digits. She is tender along the distal radial metaphyseal region. Incision site appears well. Sutures removed, Steri-Strips applied. She has no significant swelling throughout the forearm.  Impression: S/P ORIF (open reduction internal fixation) fracture [Z98.890, Z87.81] S/P ORIF (open reduction internal fixation) fracture (primary encounter diagnosis)  Plan:  31. 83 year old female status post right distal radius ORIF. Having normal postoperative pain swelling and some stiffness. Patient will start naproxen 500 mg twice daily with food for 2 weeks. She will discontinue sling and work on shoulder and elbow range of motion as she reports mild soreness in these areas along with stiffness in her shoulder and elbow. We will refer to outpatient hand therapy. She will continue with Velcro wrist brace only remove when showering and working on gentle wrist range of motion exercises. She will wear the brace when she is sleeping. She will continue nonweightbearing on the right upper extremity.  Follow-up in 3 weeks for x-rays of the right wrist   PRECAUTIONS: Keep wrist brace on most all the time except when working on range of motion when sitting.  No weightbearing.  On right upper extremity    WEIGHT BEARING RESTRICTIONS: No weightbearing on right upper extremity  PAIN:  Are you having pain?  5/10 pain at  right wrist and hand  FALLS: Has patient fallen in last 6 months? Yes. Number of falls 2  LIVING ENVIRONMENT: Lives with: lives alone  PLOF: Retired but was still independent -with basic ADLs as well as laundry and making her bed and cooking and homemaking activities.  Including driving per patient-had normal active range of motion and strength in right upper extremity  PATIENT GOALS: I want to get the swelling and the pain better in my motion and strength so that I can use it  NEXT MD VISIT: 11/10/2023  OBJECTIVE:  Note: Objective measures were completed at Evaluation unless otherwise noted.  HAND DOMINANCE: Right  ADLs: Patient using her right hand less than 25% in basic ADLs.  Unable to lift, grip, carry, pinch, pushing and pulling.  FUNCTIONAL OUTCOME MEASURES: Will assess next session  UPPER EXTREMITY ROM:     Active ROM Right eval Left eval R 11/09/23 R 11/23/23 R 11/25/23 R 12/13/23 R 12/17/23 R 12/23/23 R 01/06/24  Shoulder flexion           Shoulder abduction           Shoulder adduction           Shoulder extension           Shoulder internal rotation           Shoulder external rotation           Elbow flexion           Elbow extension           Wrist flexion 30  40 45 70 70 60 end of session 75 75 insession 85 80  Wrist extension 44  45 54 63 60 60 end of session 70 68 70  Wrist ulnar deviation 10  20 20     20   Wrist radial deviation 20  30 30     30   Wrist pronation 90  90 90     90  Wrist supination 55  70 85     90  (Blank rows = not tested)  Active ROM Right eval Left eval R 11/09/23 R 11/23/23  Thumb MCP (0-60)      Thumb IP (0-80)      Thumb Radial abd/add (0-55) 40   48 50  Thumb Palmar abd/add (0-45) 45   58 55  Thumb Opposition to Small Finger    Opposition to 5th    Index MCP (0-90) 75   80 85  Index PIP (0-100) 75   90 95  Index DIP (0-70)        Long MCP (0-90)  80   85 90  Long PIP (0-100)  85   90 100  Long DIP (0-70)        Ring MCP  (0-90)  80   85 90  Ring PIP (0-100)  100   100 100  Ring DIP (0-70)        Little MCP (0-90)  90   90 90  Little PIP (0-100)  90   90 95  Little DIP (0-70)        (  Blank rows = not tested)    HAND FUNCTION: 4/15/25Grip strength: Right: 25 lbs; Left: 59 lbs, Lateral pinch: Right: 6 lbs, Left: 12 lbs, and 3 point pinch: Right: 6 lbs, Left: 11 lbs  12/03/23 Grip strength: Right: 29 lbs; Left: 59 lbs, Lateral pinch: Right: 7 lbs, Left: 12 lbs, and 3 point pinch: Right:  7 lbs, Left: 11# 12/13/23 Grip strength: Right: 33 lbs; Left: 59 lbs, Lateral pinch: Right: 8 lbs, Left: 12 lbs, and 3 point pinch: Right:  8 lbs, Left: 11# 12/23/23 Grip strength: Right: 40 lbs; Left: 59 lbs, Lateral pinch: Right: 9 lbs, Left: 12 lbs, and 3 point pinch: Right:  10 lbs, Left: 11# 01/06/24 Grip strength: Right: 42 lbs; Left: 59 lbs, Lateral pinch: Right: 9 lbs( had thumb fx years ago) , Left: 12 lbs, and 3 point pinch: Right:  10 lbs, Left: 11#  COORDINATION:  EVAL - Impaired because of immobilization of the wrist as well as increased swelling and pain.  As well as stiffness in fingers and decreased flexion and endrange extension in all digits  SENSATION: Patient denies any sensory changes in the hand  EDEMA: Increase edema in right hand and wrist  COGNITION: Overall cognitive status: Within functional limits for tasks assessed  TREATMENT DATE: 01/06/24   Interpreter present during session Patient arrived after focusing on moist heat prior to right wrist flexion extension stretches, table slides. As well as wall push-ups Patient showed great progress in right wrist flexion extension -and reports no discomfort or pain with pushing heavy door or pushing up from a chair.   Patient continues to report increased functional use of right hand and making the bed, doing laundry, cooking, doing groceries- Patient to stay able to carry 8 pounds equal to a gallon with no discomfort or pain just heavy. Patient can  simulate 4 pounds pouring a drink. Patient do have a history of some shoulder pain limiting her to more than 5 pounds for elbow and shoulder. Add for patient some 2 pounds horizontal abduction scapular squeezes at shoulder height 10 reps as well as scapular rows 2 pounds Elbow curls 2 pounds  Patient to continue after moist heat or shower - prayer stretch  - table slides 20 reps for wrist extension.  Patient can continue with 2 pounds for all wrist and forearm motions including flexion extension of the wrist 12 reps  Decrease to about 3 times a week  patient can do 2 pounds or 3 pounds for scapular squeezes, bicep curls and shoulder abduction pain-free         medium teal putty for gripping- Continue 2 sets of 20 once a day for about a month and then can decrease to 3 times a week PATIENT EDUCATION: Education details: findings of eval and HEP  Person educated: Patient Education method: Explanation, Demonstration, Tactile cues, Verbal cues, and Handouts Education comprehension: verbalized understanding, returned demonstration, verbal cues required, and needs further education  Education comprehension: verbalized understanding, returned demonstration, verbal cues required, and needs further education   GOALS: Goals reviewed with patient? Yes   LONG TERM GOALS: Target date: 8 wks  Patient to be independent in home program to decrease edema and pain to increase thumb and digit range of motion to composite fist with no increase symptoms Baseline: MC flexion 75 to 90 degrees and PIP's 75 to 100 degrees with increased pain.  Increased tightness with extension of all digits with PIP extension -10 degrees, decreased palmar radial abduction of thumb with  increased pain.  No knowledge of home program. NOW no pain fisting thumb active range of motion within normal limits Goal status: Met  2.  Patient to be independent in scar management including scar pad use and scar massage to increase  adhesion and increased wrist flexion extension. Baseline: Patient still has 3 Steri-Strips on.  Scar adherent.  With decreased wrist flexion and extension at 30 and 44 degrees. NOW no scar adhesions active range of motion improved within normal limits Goal status: Met  3.  Right forearm supination increased to Baseline: Supination at 55 degrees with increased pain. NOW within normal limits with supination pronation Goal status: Met  4.  Right wrist flexion extension improved to within functional limits for patient to be able to use hand and more than 75% of bathing and dressing without increased symptoms Baseline: Right wrist flexion 30 degrees and extension 44, ulnar deviation 10 degrees.  Using right hand less than 25% in ADLs. NOW active range of motion for the wrist within normal limits and pain-free in ADLs and IADLs Goal status: Met  5.  Right grip and prehension strength increased to more than 50% compared to the left for patient to initiate carrying groceries, hold glass, cut with a knife, hold a plate without increase symptoms Baseline: Grip and prehension not tested 3 weeks postop NOW grip improved to 42 pounds and prehension to 9 and 10 pounds pain-free for patient to carry up to 8 pounds and left and simulate pouring 4 pounds Goal status: Met  6.  Patient to be independent in home program to increase shoulder and elbow active range of motion to within functional limits symptom-free. Baseline: Patient report and show increase stiffness and discomfort in right shoulder range of motion in all planes with internal rotation the worst.  And elbow extension. NOW elbow range of motion within normal limits and shoulder active range of motion ADLs within normal limits patient to have some shoulder tendinitis history.  Patient Goal status: Met  ASSESSMENT:  CLINICAL IMPRESSION: Patient seen by  occupational therapy for right distal radius fracture with ORIF on 10/12/2023 by Dr. Mozell Arias.      Patient reports increased functional use and ADLs and IADLs in the home.  This session patient returned with increased wrist flexion extension to about within normal limits now as well as no increased symptoms with pushing a door, pushing up and rotation turning a doorknob.  Patient was limited in wrist flexion and extension.  Patient made great progress last 2 weeks wrist flexion /extension.  Patient active range of motion within normal limits now and strength 5 -/5 in all planes.  As well as grip improved to 42 pounds and prevention to 9 and 10 pounds symptom-free.  Patient independent in functional use of right dominant hand in ADLs and IADLs and in agreement to be discharged with home program.    PERFORMANCE DEFICITS: in functional skills including ADLs, IADLs, coordination, dexterity, proprioception, edema, ROM, strength, pain, flexibility, Fine motor control, decreased knowledge of precautions, decreased knowledge of use of DME, UE functional use, and scar tissue, cognitive skills including  IMPAIRMENTS: are limiting patient from ADLs, IADLs, rest and sleep, play, leisure, and social participation.   COMORBIDITIES: may have co-morbidities  that affects occupational performance. Patient will benefit from skilled OT to address above impairments and improve overall function.  MODIFICATION OR ASSISTANCE TO COMPLETE EVALUATION: Min-Moderate modification of tasks or assist with assess necessary to complete an evaluation.  OT OCCUPATIONAL PROFILE AND HISTORY:  Detailed assessment: Review of records and additional review of physical, cognitive, psychosocial history related to current functional performance.  CLINICAL DECISION MAKING: Moderate - several treatment options, min-mod task modification necessary  REHAB POTENTIAL: Good  EVALUATION COMPLEXITY: Moderate   PLAN:  OT FREQUENCY: 2x/week  OT DURATION: 8 weeks  PLANNED INTERVENTIONS: 97168 OT Re-evaluation, 97535 self care/ADL training,  97110 therapeutic exercise, 97530 therapeutic activity, 97112 neuromuscular re-education, 97140 manual therapy, 97018 paraffin, 65784 fluidotherapy, 97034 contrast bath, 97760 Orthotics management and training, scar mobilization, passive range of motion, patient/family education, and DME and/or AE instructions  CONSULTED AND AGREED WITH PLAN OF CARE: Patient and friend  Heloise Lobo, OTR/L,CLT 01/06/2024, 9:32 AM

## 2024-01-20 ENCOUNTER — Encounter: Payer: PRIVATE HEALTH INSURANCE | Admitting: Occupational Therapy

## 2024-02-05 IMAGING — MR MR HEAD W/O CM
12 series · 48 of 48 positions shown · non-contrast
Comparison: None.

CLINICAL DATA: Syncope/presyncope

EXAM:
MRI HEAD WITHOUT CONTRAST
TECHNIQUE: Multiplanar, multiecho pulse sequences of the brain and surrounding
structures were obtained without intravenous contrast.

[Series 5: ax dwi_tracew · axial · 3.0mm · 0.65mm/px · z∈[-115,+40]mm · 3 of 48 slices shown]
[im 1/48]
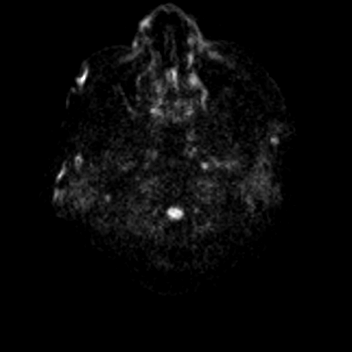
[im 24/48]
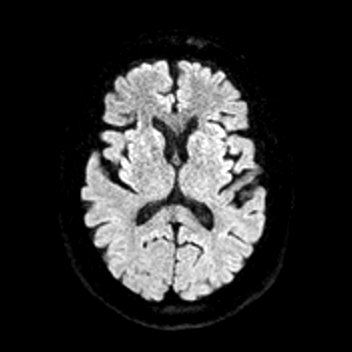
[im 48/48]
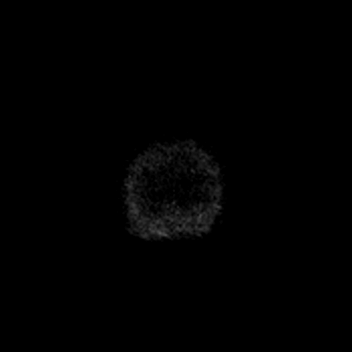

[Series 6: ax dwi_adc · axial · 3.0mm · 0.65mm/px · z∈[-115,+40]mm · 3 of 48 slices shown]
[im 1/48]
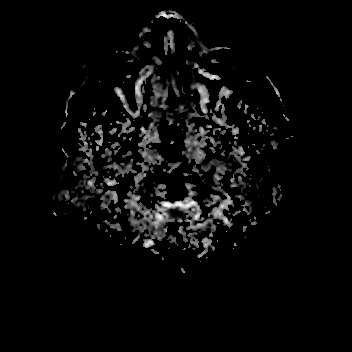
[im 24/48]
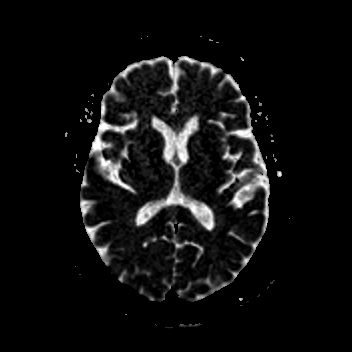
[im 48/48]
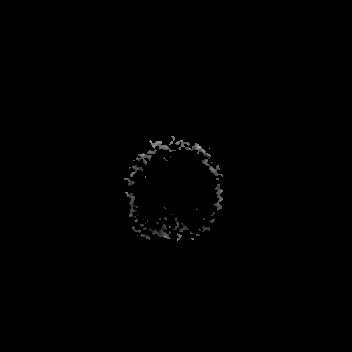

[Series 7: cor dwi_tracew · coronal · 5.0mm · 0.60mm/px · 3 of 36 slices shown]
[im 1/36]
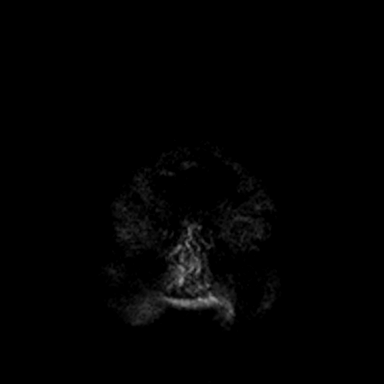
[im 18/36]
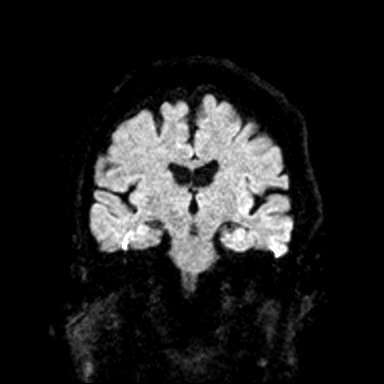
[im 36/36]
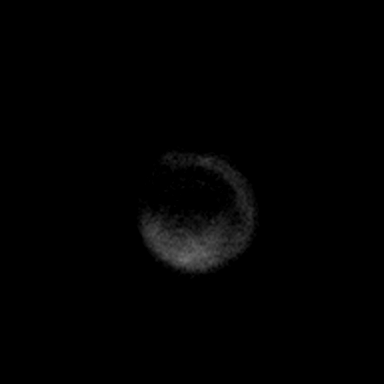

[Series 8: cor dwi_adc · coronal · 5.0mm · 0.60mm/px · 3 of 36 slices shown]
[im 1/36]
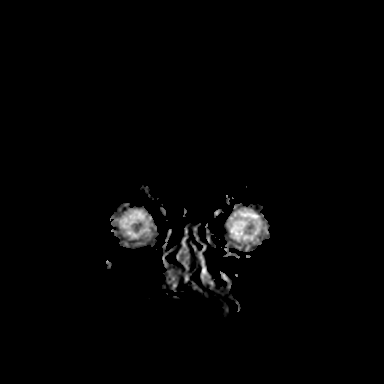
[im 18/36]
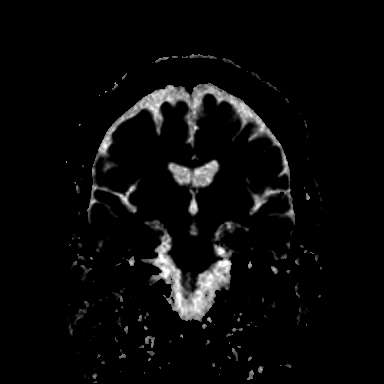
[im 36/36]
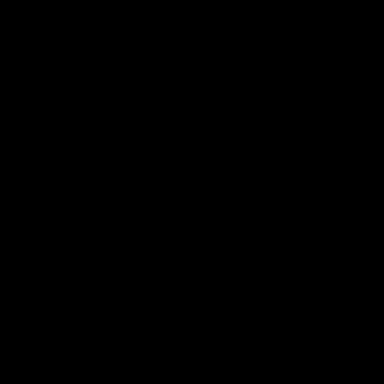

[Series 9: T1 · sagittal · 5.0mm · 0.62mm/px · 2 of 22 slices shown (1 of 2)]
[im 1/22]
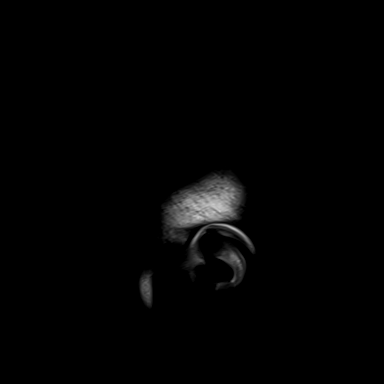
[im 22/22]
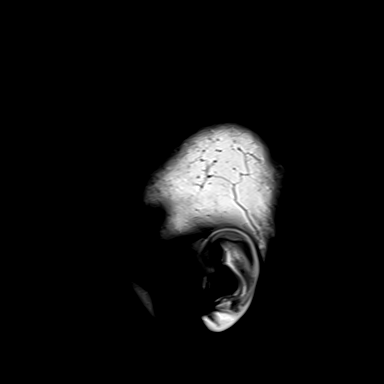

[Series 10: T2 · axial · 5.0mm · 0.53mm/px · z∈[-107,+31]mm · 2 of 24 slices shown (1 of 2)]
[im 1/24]
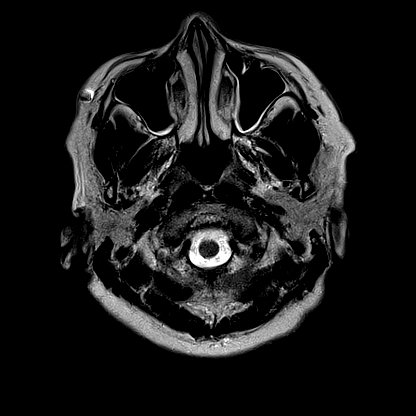
[im 24/24]
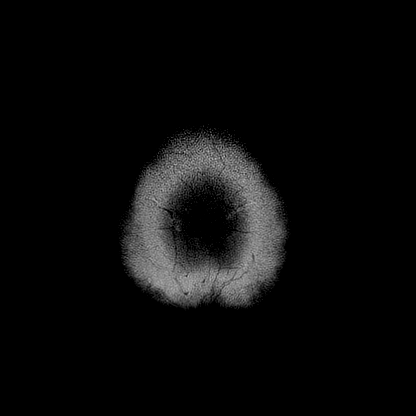

[Series 11: mag_images · axial · 3.0mm · 0.90mm/px · z∈[-127,+50]mm · 5 of 60 slices shown]
[im 1/60]
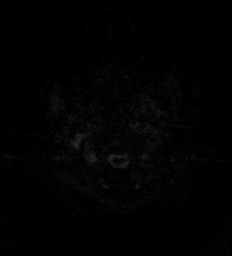
[im 15/60]
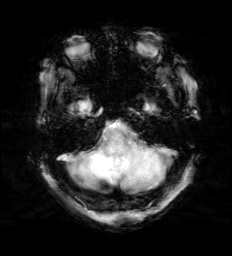
[im 30/60]
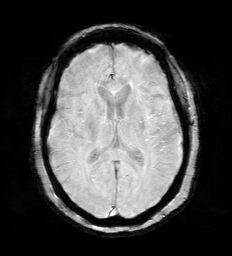
[im 45/60]
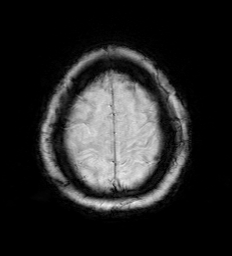
[im 60/60]
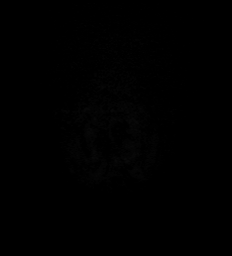

[Series 12: pha_images · axial · 3.0mm · 0.90mm/px · z∈[-127,+50]mm · 5 of 60 slices shown]
[im 1/60]
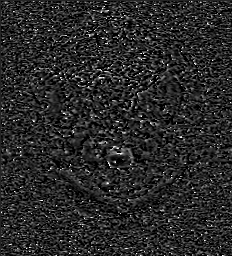
[im 15/60]
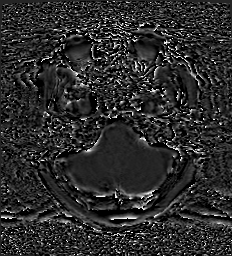
[im 30/60]
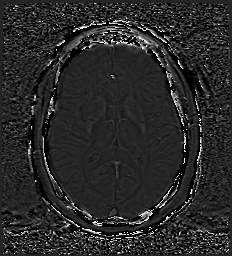
[im 45/60]
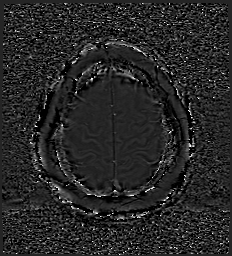
[im 60/60]
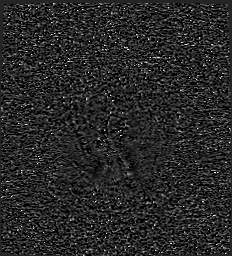

[Series 13: swi_images · axial · 3.0mm · 0.90mm/px · z∈[-127,+50]mm · 5 of 60 slices shown]
[im 1/60]
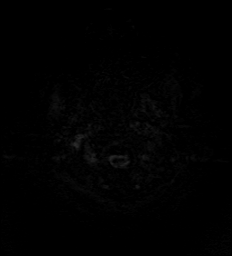
[im 15/60]
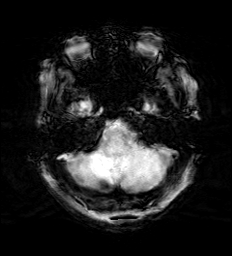
[im 30/60]
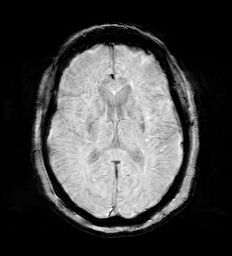
[im 45/60]
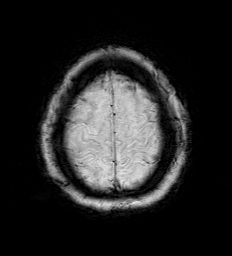
[im 60/60]
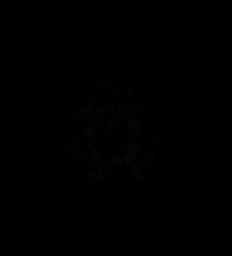

[Series 15: FLAIR · axial · 3.0mm · 0.53mm/px · z∈[-111,+36]mm · 3 of 42 slices shown]
[im 1/42]
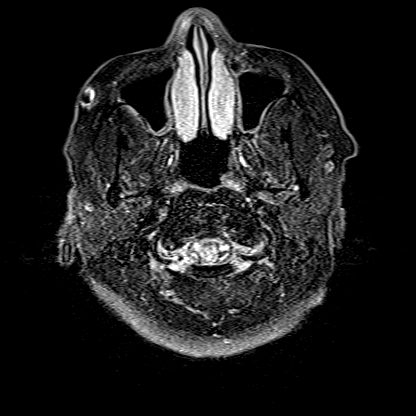
[im 21/42]
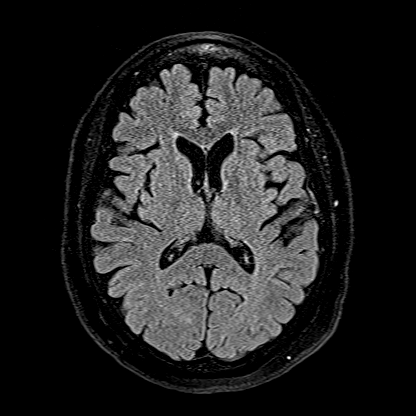
[im 42/42]
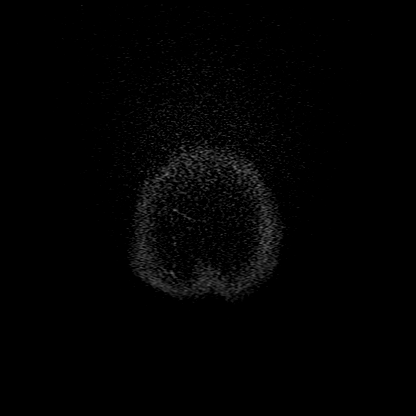

[Series 16: T1 · axial · 1.0mm · 0.98mm/px · z∈[-118,+41]mm · 12 of 160 slices shown (2 of 2)]
[im 1/160]
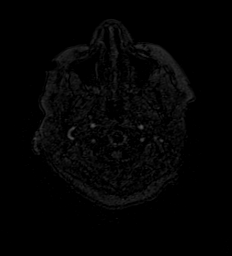
[im 15/160]
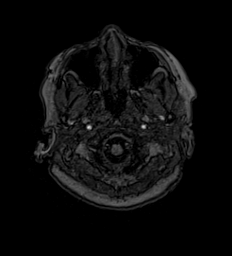
[im 29/160]
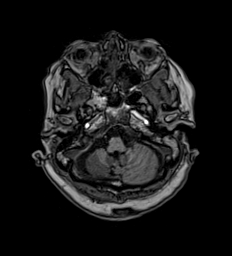
[im 44/160]
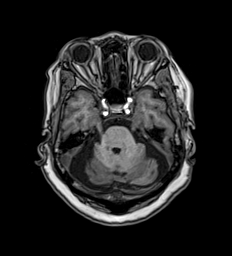
[im 58/160]
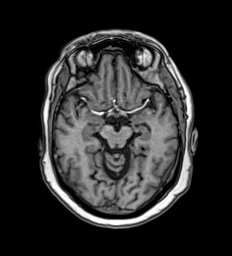
[im 73/160]
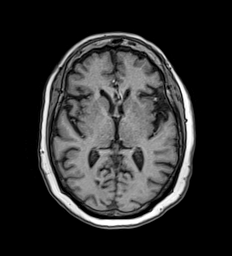
[im 87/160]
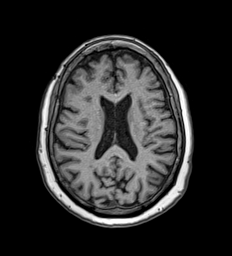
[im 102/160]
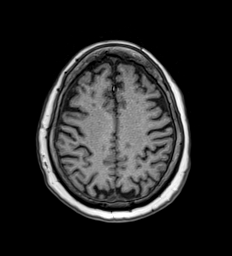
[im 116/160]
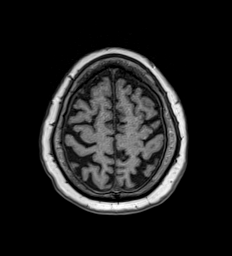
[im 131/160]
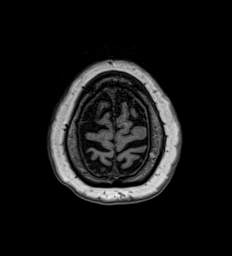
[im 145/160]
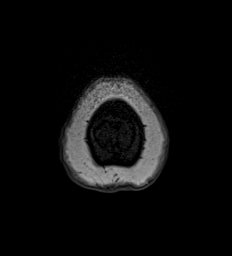
[im 160/160]
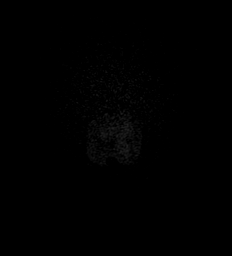

[Series 17: T2 · coronal · 5.0mm · 0.57mm/px · 2 of 26 slices shown (2 of 2)]
[im 1/26]
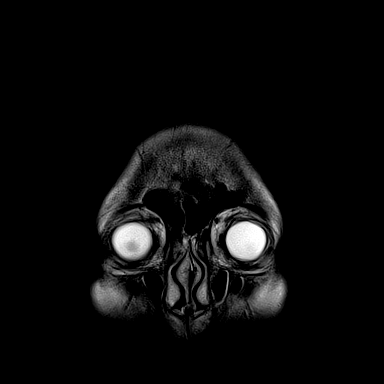
[im 26/26]
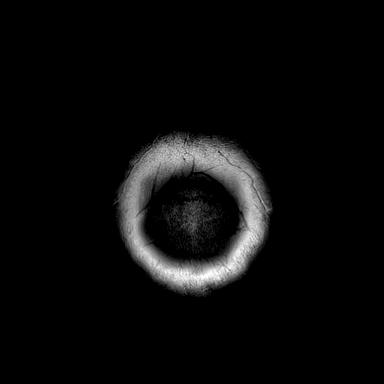

[48 of 48 positions shown; findings below may reference images not displayed]

FINDINGS: Brain: No restricted diffusion to suggest acute or subacute infarct.
No acute hemorrhage mass, mass effect, or midline shift. No
hydrocephalus or extra-axial collection. Scattered T2 hyperintense
signal in the periventricular white matter, likely the sequela of
chronic small vessel ischemic disease. Atrophy of the right
cerebellar hemisphere, with decreased T1 signal and increased T2
signal, which could reflect prior right cerebellar infarct. Similar
increased T2 signal and decreased T1 signal in a smaller area in the
superior left cerebellum, also concerning for prior infarct.

Vascular: Normal flow voids.

Skull and upper cervical spine: Normal marrow signal.

Sinuses/Orbits: Mild mucosal thickening in the maxillary sinuses and
ethmoid air cells. The orbits are unremarkable.

Other: Trace fluid in the mastoid air cells.
IMPRESSION: 1. Right-greater-than-left cerebellar volume loss, likely sequela of
prior infarcts.
2. No acute infarct or other intracranial process.

## 2024-07-19 ENCOUNTER — Encounter: Admission: RE | Disposition: A | Payer: Self-pay | Source: Home / Self Care | Attending: Ophthalmology

## 2024-07-19 ENCOUNTER — Ambulatory Visit: Admitting: Anesthesiology

## 2024-07-19 ENCOUNTER — Other Ambulatory Visit: Payer: Self-pay

## 2024-07-19 ENCOUNTER — Encounter: Payer: Self-pay | Admitting: Ophthalmology

## 2024-07-19 ENCOUNTER — Ambulatory Visit
Admission: RE | Admit: 2024-07-19 | Discharge: 2024-07-19 | Disposition: A | Payer: PRIVATE HEALTH INSURANCE | Attending: Ophthalmology | Admitting: Ophthalmology

## 2024-07-19 HISTORY — PX: CATARACT EXTRACTION W/PHACO: SHX586

## 2024-07-19 SURGERY — PHACOEMULSIFICATION, CATARACT, WITH IOL INSERTION
Anesthesia: Topical | Site: Eye | Laterality: Left

## 2024-07-19 MED ORDER — SIGHTPATH DOSE#1 BSS IO SOLN
INTRAOCULAR | Status: DC | PRN
  Administered 2024-07-19: 108 mL via OPHTHALMIC

## 2024-07-19 MED ORDER — ONDANSETRON HCL 4 MG/2ML IJ SOLN
INTRAMUSCULAR | Status: DC | PRN
  Administered 2024-07-19: 4 mg via INTRAVENOUS

## 2024-07-19 MED ORDER — FENTANYL CITRATE (PF) 100 MCG/2ML IJ SOLN
INTRAMUSCULAR | Status: DC | PRN
  Administered 2024-07-19: 50 ug via INTRAVENOUS

## 2024-07-19 MED ORDER — NA CHONDROIT SULF-NA HYALURON 40-30 MG/ML IO SOSY
INTRAOCULAR | Status: DC | PRN
  Administered 2024-07-19: .5 mL via INTRAOCULAR

## 2024-07-19 MED ORDER — BRIMONIDINE TARTRATE-TIMOLOL 0.2-0.5 % OP SOLN
OPHTHALMIC | Status: DC | PRN
  Administered 2024-07-19: 1 [drp] via OPHTHALMIC

## 2024-07-19 MED ORDER — PHENYLEPHRINE HCL 10 % OP SOLN
1.0000 [drp] | OPHTHALMIC | Status: AC
  Administered 2024-07-19 (×3): 1 [drp] via OPHTHALMIC

## 2024-07-19 MED ORDER — MIDAZOLAM HCL (PF) 2 MG/2ML IJ SOLN
INTRAMUSCULAR | Status: DC | PRN
  Administered 2024-07-19 (×2): 1 mg via INTRAVENOUS

## 2024-07-19 MED ORDER — LACTATED RINGERS IV SOLN: INTRAVENOUS | Status: DC

## 2024-07-19 MED ORDER — TETRACAINE HCL 0.5 % OP SOLN
OPHTHALMIC | Status: AC
  Filled 2024-07-19: qty 4

## 2024-07-19 MED ORDER — PHENYLEPHRINE HCL 10 % OP SOLN
OPHTHALMIC | Status: AC
  Filled 2024-07-19: qty 5

## 2024-07-19 MED ORDER — SIGHTPATH DOSE#1 NA HYALUR & NA CHOND-NA HYALUR IO KIT
PACK | INTRAOCULAR | Status: DC | PRN
  Administered 2024-07-19: 1 via OPHTHALMIC

## 2024-07-19 MED ORDER — ONDANSETRON HCL 4 MG/2ML IJ SOLN: 4.0000 mg | Freq: Once | INTRAMUSCULAR | Status: DC | PRN

## 2024-07-19 MED ORDER — TETRACAINE HCL 0.5 % OP SOLN
1.0000 [drp] | OPHTHALMIC | Status: DC | PRN
  Administered 2024-07-19 (×3): 1 [drp] via OPHTHALMIC

## 2024-07-19 MED ORDER — TRYPAN BLUE 0.06 % IO SOSY
PREFILLED_SYRINGE | INTRAOCULAR | Status: DC | PRN
  Administered 2024-07-19: .5 mL via INTRAOCULAR

## 2024-07-19 MED ORDER — MOXIFLOXACIN HCL 0.5 % OP SOLN
OPHTHALMIC | Status: DC | PRN
  Administered 2024-07-19: .2 mL via OPHTHALMIC

## 2024-07-19 MED ORDER — ACETAMINOPHEN 500 MG PO TABS
1000.0000 mg | ORAL_TABLET | Freq: Once | ORAL | Status: AC | PRN
  Administered 2024-07-19: 1000 mg via ORAL

## 2024-07-19 MED ORDER — FENTANYL CITRATE (PF) 100 MCG/2ML IJ SOLN
INTRAMUSCULAR | Status: AC
  Filled 2024-07-19: qty 2

## 2024-07-19 MED ORDER — ACETAMINOPHEN 500 MG PO TABS
ORAL_TABLET | ORAL | Status: AC
  Filled 2024-07-19: qty 2

## 2024-07-19 MED ORDER — CYCLOPENTOLATE HCL 2 % OP SOLN
1.0000 [drp] | OPHTHALMIC | Status: AC
  Administered 2024-07-19 (×3): 1 [drp] via OPHTHALMIC

## 2024-07-19 MED ORDER — LIDOCAINE HCL (PF) 2 % IJ SOLN
INTRAOCULAR | Status: DC | PRN
  Administered 2024-07-19: 2 mL

## 2024-07-19 MED ORDER — ONDANSETRON HCL 4 MG/2ML IJ SOLN
INTRAMUSCULAR | Status: AC
  Filled 2024-07-19: qty 2

## 2024-07-19 MED ORDER — CYCLOPENTOLATE HCL 2 % OP SOLN
OPHTHALMIC | Status: AC
  Filled 2024-07-19: qty 2

## 2024-07-19 MED ORDER — MIDAZOLAM HCL 2 MG/2ML IJ SOLN
INTRAMUSCULAR | Status: AC
  Filled 2024-07-19: qty 2

## 2024-07-19 MED ORDER — SIGHTPATH DOSE#1 BSS IO SOLN
INTRAOCULAR | Status: DC | PRN
  Administered 2024-07-19: 15 mL via INTRAOCULAR

## 2024-07-19 SURGICAL SUPPLY — 7 items
FEE CATARACT SUITE SIGHTPATH (MISCELLANEOUS) ×1 IMPLANT
GLOVE BIOGEL PI IND STRL 8 (GLOVE) ×1 IMPLANT
GLOVE SURG LX STRL 7.5 STRW (GLOVE) ×1 IMPLANT
GLOVE SURG SYN 6.5 PF PI BL (GLOVE) ×1 IMPLANT
LENS IOL CLRN CLEARN 21.5 (Intraocular Lens) IMPLANT
NDL FILTER BLUNT 18X1 1/2 (NEEDLE) ×1 IMPLANT
SYR 3ML LL SCALE MARK (SYRINGE) ×1 IMPLANT

## 2024-07-19 NOTE — Anesthesia Preprocedure Evaluation (Addendum)
 Anesthesia Evaluation  Patient identified by MRN, date of birth, ID band Patient awake    Reviewed: Allergy & Precautions, NPO status , Patient's Chart, lab work & pertinent test results  History of Anesthesia Complications Negative for: history of anesthetic complications  Airway Mallampati: IV   Neck ROM: Full    Dental  (+) Partial Upper   Pulmonary neg pulmonary ROS   Pulmonary exam normal breath sounds clear to auscultation       Cardiovascular hypertension, + Peripheral Vascular Disease  Normal cardiovascular exam Rhythm:Regular Rate:Normal     Neuro/Psych negative neurological ROS     GI/Hepatic negative GI ROS,,,  Endo/Other  Prediabetes   Renal/GU negative Renal ROS     Musculoskeletal   Abdominal   Peds  Hematology  (+) Blood dyscrasia, anemia   Anesthesia Other Findings   Reproductive/Obstetrics                              Anesthesia Physical Anesthesia Plan  ASA: 2  Anesthesia Plan: MAC   Post-op Pain Management:    Induction: Intravenous  PONV Risk Score and Plan: 2 and Treatment may vary due to age or medical condition, Midazolam  and TIVA  Airway Management Planned: Natural Airway and Nasal Cannula  Additional Equipment:   Intra-op Plan:   Post-operative Plan:   Informed Consent: I have reviewed the patients History and Physical, chart, labs and discussed the procedure including the risks, benefits and alternatives for the proposed anesthesia with the patient or authorized representative who has indicated his/her understanding and acceptance.     Dental advisory given and Interpreter used for interview  Plan Discussed with: CRNA  Anesthesia Plan Comments: (LMA/GETA backup discussed.  Patient consented for risks of anesthesia including but not limited to:  - adverse reactions to medications - damage to eyes, teeth, lips or other oral mucosa - nerve  damage due to positioning  - sore throat or hoarseness - damage to heart, brain, nerves, lungs, other parts of body or loss of life  Informed patient about role of CRNA in peri- and intra-operative care.  Patient voiced understanding.)         Anesthesia Quick Evaluation

## 2024-07-19 NOTE — Transfer of Care (Signed)
 Immediate Anesthesia Transfer of Care Note  Patient: Anna Campbell  Procedure(s) Performed: PHACOEMULSIFICATION, CATARACT, WITH IOL INSERTION (Left: Eye)  Patient Location: PACU  Anesthesia Type: MAC  Level of Consciousness: awake, alert  and patient cooperative  Airway and Oxygen Therapy: Patient Spontanous Breathing and Patient connected to supplemental oxygen  Post-op Assessment: Post-op Vital signs reviewed, Patient's Cardiovascular Status Stable, Respiratory Function Stable, Patent Airway and No signs of Nausea or vomiting  Post-op Vital Signs: Reviewed and stable  Complications: No notable events documented.

## 2024-07-19 NOTE — Discharge Instructions (Addendum)
 El cuidado despu?s de una operaci?n de cataratas ?Cataract Surgery, Care After (Spanish) ? ?Esta hoja le da informaci?n sobre c?mo cuidarse despu?s de su cirug?a. Su oftalm?logo puede darle instrucciones m?s espec?ficas tambi?n. Si tiene problemas o preguntas, comun?quese con su doctor en [facility], [phone]. ? ??Qu? puedo esperar despu?s de la cirug?a? ?Es normal tener: ?Picaz?n ?Sensaci?n de tener un cuerpo extra?o (se siente como un grano de Investment banker, corporate ojo) ?Secreci?n acuosa (lagrimeo excesivo) ?Sensibilidad a la luz y al tacto ?Moretones en el ojo o a su alrededor ?Visi?n ligeramente borrosa ? ?Siga estas instrucciones en casa: ?No se toque ni se frote los ojos. ?Es posible que le digan que use una pantalla protectora o lentes de sol para proteger sus ojos. ?No se ponga un lente de contacto en el ojo operado hasta que su doctor lo apruebe. ?Mantenga los p?rpados y la cara limpios y secos. ?No deje que el agua le caiga directamente en la cara mientras se duche. ?Evite el jab?n y champ? en los ojos. ?No se maquille los ojos por [employer]. ? ?Rev?sese su ojo todos los d?as para ver si hay  signos de infecci?n. Est? atento(a): ?Enrojecimiento, hinchaz?n o dolor. ?L?quido, sangre o pus. ?Empeoramiento de la visi?n. ?Aumento de la sensibilidad a la luz o al tacto. ? ?Actividad: ?Physicist, medical d?a, evite agacharse y leer. Puede volver a leer y a agacharse al d?a siguiente. ?No maneje ni use maquinaria pesada durante al menos 24 horas. ?Evite las actividades vigorosas durante una semana. Est? bien Soil scientist, usar la cinta de correr, usar la bicicleta est?tica y [employer]. ?No levante objetos pesados (m?s de 20 libras) por una semana. ?No realice trabajos de jardiner?a ni tareas dom?sticas sucias en la casa (como limpiar los pisos, los ba?os, pasar la aspiradora, etc.) durante una semana. ?No nade ni use un jacuzzi durante 2 semanas.  ?Pregunte a su doctor cu?ndo  usted puede volver a trabajar. ? ?Instrucciones generales: ?Tome o aplique los medicamentos recetados y de venta libre seg?n le indique su doctor, incluyendo las gotas para los ojos y las pomadas. ?Contin?e tomando los medicamentos que fueron suspendidos antes de la cirug?a, a menos que su doctor le indique lo contrario. ?Vaya a todas sus citas de seguimiento que ya fueron programadas. ? ? ?P?ngase en contacto con un proveedor de atenci?n m?dica si: ?Le aparecen m?s moretones alrededor del ojo. ?Tiene dolor que no se Systems analyst. ?Tiene fiebre. ?Le sale l?quido, pus o sangre del ojo o de la incisi?n. ?Su sensibilidad a la Actor. ?Tiene manchas (moscas volantes) o destellos de luz en la vista. ?Tiene n?useas o v?mitos. ? ?Vaya al Departamento de Emergencias m?s cercana o llame al 911 si: ?Pierde la vista de forma repentina. ?Tiene un dolor de ojo que es intenso y que se est? empeorando. ?

## 2024-07-19 NOTE — Op Note (Signed)
 PREOPERATIVE DIAGNOSIS:  Nuclear sclerotic cataract of the left eye.   POSTOPERATIVE DIAGNOSIS:  Nuclear sclerotic cataract of the left eye.   OPERATIVE PROCEDURE: Phacoemulsification with IOL Implant Left Eye   SURGEON:  Curtistine Fava, MD   ANESTHESIA:  Anesthesiologist: Shellie Odor, MD CRNA: Jahoo, Sonia, CRNA  1.      Managed anesthesia care. 2.     0.53ml of epi-Shugarcaine was instilled following the paracentesis   COMPLICATIONS:  None.   TECHNIQUE:   Phacoemulsification divide and conquer   DESCRIPTION OF PROCEDURE:  The patient was examined and consented in the preoperative holding area where the aforementioned topical anesthesia was applied to the left eye and then brought back to the Operating Room where the left eye was prepped and draped in the usual sterile ophthalmic fashion and a lid speculum was placed. A paracentesis was created with the side port blade and the anterior chamber was filled with epi-Shugarcaine  then trypan blue, washed out with BSS, followed by viscoelastic. A clear corneal incision was performed with the steel keratome. A continuous curvilinear capsulorrhexis was performed with a cystotome followed by the capsulorrhexis forceps. Hydrodissection and hydrodelineation were carried out with BSS on a blunt cannula. The lens was removed in a divide and conquer technique and the remaining cortical material was removed with the irrigation-aspiration handpiece. The capsular bag was inflated with viscoelastic and the [id number] +21.5 lens was placed in the capsular bag without complication. The remaining viscoelastic was removed from the eye with the irrigation-aspiration handpiece. The wounds were hydrated. The anterior chamber was flushed with BSS and the eye was inflated to physiologic pressure. 0.1ml of Vigamox was placed in the anterior chamber. The wounds were found to be water tight. The eye was dressed with Combigan and covered with a clear shield to be worn until  the first postoperative day appointment. The patient was given protective glasses to wear throughout the day. The patient was also given drops with which to begin a drop regimen today and will follow-up with me in one day. Implant Name Type Inv. Item Serial No. Manufacturer Lot No. LRB No. Used Action  LENS IOL CLRN CLEARN 21.5 - [id number] Intraocular Lens LENS IOL CLRN CLEARN 21.5 [id number] [employer]  Left 1 Implanted    Procedure(s): PHACOEMULSIFICATION, CATARACT, WITH IOL INSERTION 6.48 00:52.2 (Left)  Electronically signed: Curtistine PARAS [facility] 07/19/2024 12:44 PM

## 2024-07-19 NOTE — Anesthesia Postprocedure Evaluation (Signed)
 Anesthesia Post Note  Patient: Anna Campbell  Procedure(s) Performed: PHACOEMULSIFICATION, CATARACT, WITH IOL INSERTION 6.48 00:52.2 (Left: Eye)  Patient location during evaluation: PACU Anesthesia Type: MAC Level of consciousness: awake and alert, oriented and patient cooperative Pain management: pain level controlled Vital Signs Assessment: post-procedure vital signs reviewed and stable Respiratory status: spontaneous breathing, nonlabored ventilation and respiratory function stable Cardiovascular status: blood pressure returned to baseline and stable Postop Assessment: adequate PO intake Anesthetic complications: no   No notable events documented.   Last Vitals:  Vitals:   07/19/24 1245 07/19/24 1248  BP: (!) 108/95 (!) 108/95  Pulse:  76  Resp:  15  Temp:  36.6 C  SpO2:  100%    Last Pain:  Vitals:   07/19/24 1248  TempSrc:   PainSc: 3                  Alfonso Ruths

## 2024-07-19 NOTE — H&P (Signed)
 [facility]   Primary Care Physician:  Lenon Layman ORN, MD Ophthalmologist: Dr. Curtistine Fava  Pre-Procedure History & Physical: HPI:  Anna Campbell is a 83 y.o. female here for cataract surgery.   Past Medical History:  Diagnosis Date   Aortic atherosclerosis    Hypercholesteremia    Hypertension    Osteoporosis    PVD (peripheral vascular disease)    Wears dentures    partial  upper    Past Surgical History:  Procedure Laterality Date   ABDOMINAL HYSTERECTOMY     BIOPSY  04/06/2023   Procedure: BIOPSY;  Surgeon: Maryruth Ole DASEN, MD;  Location: [facility] END[facility];  Service: Endoscopy;;   CHOLECYSTECTOMY     ESOPHAGOGASTRODUODENOSCOPY (EGD) WITH PROPOFOL  N/A 04/06/2023   Procedure: ESOPHAGOGASTRODUODENOSCOPY (EGD) WITH PROPOFOL ;  Surgeon: Maryruth Ole DASEN, MD;  Location: [facility] ENDOSCOPY;  Service: Endoscopy;  Laterality: N/A;  SPANISH INTERPRETER   JOINT REPLACEMENT     OPEN REDUCTION INTERNAL FIXATION (ORIF) DISTAL RADIAL FRACTURE Right 10/12/2023   Procedure: OPEN REDUCTION INTERNAL FIXATION (ORIF) DISTAL RADIUS FRACTURE;  Surgeon: Kathlynn Sharper, MD;  Location: [facility];  Service: Orthopedics;  Laterality: Right;   SCLEROTHERAPY  2024    Prior to Admission medications   Medication Sig Start Date End Date Taking? Authorizing Provider  alendronate (FOSAMAX) 35 MG tablet Take 35 mg by mouth once a week. Take 1 tablet (35 mg total) by mouth every 7 (seven) days Take with a full glass of water. Do not lie down for the next 30 min. 09/04/21  Yes [provider]  ascorbic acid (VITAMIN C) 500 MG tablet Take 500 mg by mouth daily.   Yes [provider]  atorvastatin (LIPITOR) 40 MG tablet Take 40 mg by mouth daily. 02/04/22  Yes [provider]  cetirizine (ZYRTEC) 10 MG tablet Take 10 mg by mouth daily as needed for allergies.   Yes [provider]  Cholecalciferol 50 MCG (2000 UT) TABS Take 2,000 Units by mouth daily.   Yes  [provider]  folic acid  (FOLVITE ) 1 MG tablet Take 1 mg by mouth daily. 09/09/21  Yes [provider]  HYDROcodone -acetaminophen  (NORCO/VICODIN) 5-325 MG tablet Take 1 tablet by mouth every 6 (six) hours as needed for moderate pain (pain score 4-6). 10/12/23  Yes Kathlynn Sharper, MD  losartan-hydrochlorothiazide (HYZAAR) 100-25 MG tablet Take 1 tablet by mouth daily. 02/04/22  Yes [provider]  MAGNESIUM PO Take 1 tablet by mouth daily.   Yes [provider]  melatonin 3 MG TABS tablet Take 3 mg by mouth at bedtime as needed.   Yes [provider]  pyridOXINE (VITAMIN B6) 50 MG tablet Take 50 mg by mouth daily.   Yes [provider]  Turmeric (QC TUMERIC COMPLEX) 500 MG CAPS Take 1 tablet by mouth daily.   Yes [provider]  Vitamin A 2400 MCG (8000 UT) TABS Take 1 tablet by mouth daily.   Yes [provider]  bisacodyl (DULCOLAX) 5 MG EC tablet Take 5 mg by mouth daily as needed for moderate constipation.    [provider]    Allergies as of 06/19/2024 - Review Complete 12/16/2023  Allergen Reaction Noted   Penicillin g Anaphylaxis 09/14/2021   Bactrim [sulfamethoxazole-trimethoprim] Rash 10/11/2023    Family History  Problem Relation Age of Onset   Hypertension Mother     Social History   Socioeconomic History   Marital status: Unknown    Spouse name: Not on file  Number of children: Not on file   Years of education: Not on file   Highest education level: Not on file  Occupational History   Not on file  Tobacco Use   Smoking status: Never   Smokeless tobacco: Never  Vaping Use   Vaping status: Never Used  Substance and Sexual Activity   Alcohol use: Never   Drug use: Never   Sexual activity: Not Currently  Other Topics Concern   Not on file  Social History Narrative   Not on file   Social Drivers of Health   Financial Resource Strain: Low Risk  (06/29/2024)   Received from [facility]   Overall Financial Resource Strain (CARDIA)    Difficulty of Paying Living Expenses: Not hard at all  Food Insecurity: No Food Insecurity (06/29/2024)   Received from [facility]   Hunger Vital Sign    Within the past 12 months, you worried that your food would run out before you got the money to buy more.: Never true    Within the past 12 months, the food you bought just didn't last and you didn't have money to get more.: Never true  Transportation Needs: No Transportation Needs (06/29/2024)   Received from [facility] - Transportation    In the past 12 months, has lack of transportation kept you from medical appointments or from getting medications?: No    Lack of Transportation (Non-Medical): No  Physical Activity: Not on file  Stress: Not on file  Social Connections: Not on file  Intimate Partner Violence: Not on file    Review of Systems: See HPI, otherwise negative ROS  Physical Exam: BP (!) 175/79   Temp 97.6 F (36.4 C) (Temporal)   Resp 12   Ht 5' 4 (1.626 m)   Wt 75.4 kg   SpO2 100%   BMI 28.55 kg/m  General:   Alert, cooperative in NAD Head:  Normocephalic and atraumatic. Respiratory:  Normal work of breathing. Cardiovascular:  RRR  Impression/Plan: Anna Campbell is here for cataract surgery.  Risks, benefits, limitations, and alternatives regarding cataract surgery have been reviewed with the patient.  Questions have been answered.  All parties agreeable.   Curtistine JINNY Fava, MD  07/19/2024, 11:42 AM

## 2024-07-20 ENCOUNTER — Encounter: Payer: Self-pay | Admitting: Ophthalmology

## 2024-07-24 NOTE — Discharge Instructions (Signed)
 El cuidado despu?s de una operaci?n de cataratas ?Cataract Surgery, Care After (Spanish) ? ?Esta hoja le da informaci?n sobre c?mo cuidarse despu?s de su cirug?a. Su oftalm?logo puede darle instrucciones m?s espec?ficas tambi?n. Si tiene problemas o preguntas, comun?quese con su doctor en [facility], [phone]. ? ??Qu? puedo esperar despu?s de la cirug?a? ?Es normal tener: ?Picaz?n ?Sensaci?n de tener un cuerpo extra?o (se siente como un grano de Investment banker, corporate ojo) ?Secreci?n acuosa (lagrimeo excesivo) ?Sensibilidad a la luz y al tacto ?Moretones en el ojo o a su alrededor ?Visi?n ligeramente borrosa ? ?Siga estas instrucciones en casa: ?No se toque ni se frote los ojos. ?Es posible que le digan que use una pantalla protectora o lentes de sol para proteger sus ojos. ?No se ponga un lente de contacto en el ojo operado hasta que su doctor lo apruebe. ?Mantenga los p?rpados y la cara limpios y secos. ?No deje que el agua le caiga directamente en la cara mientras se duche. ?Evite el jab?n y champ? en los ojos. ?No se maquille los ojos por [employer]. ? ?Rev?sese su ojo todos los d?as para ver si hay  signos de infecci?n. Est? atento(a): ?Enrojecimiento, hinchaz?n o dolor. ?L?quido, sangre o pus. ?Empeoramiento de la visi?n. ?Aumento de la sensibilidad a la luz o al tacto. ? ?Actividad: ?Physicist, medical d?a, evite agacharse y leer. Puede volver a leer y a agacharse al d?a siguiente. ?No maneje ni use maquinaria pesada durante al menos 24 horas. ?Evite las actividades vigorosas durante una semana. Est? bien Soil scientist, usar la cinta de correr, usar la bicicleta est?tica y [employer]. ?No levante objetos pesados (m?s de 20 libras) por una semana. ?No realice trabajos de jardiner?a ni tareas dom?sticas sucias en la casa (como limpiar los pisos, los ba?os, pasar la aspiradora, etc.) durante una semana. ?No nade ni use un jacuzzi durante 2 semanas.  ?Pregunte a su doctor cu?ndo  usted puede volver a trabajar. ? ?Instrucciones generales: ?Tome o aplique los medicamentos recetados y de venta libre seg?n le indique su doctor, incluyendo las gotas para los ojos y las pomadas. ?Contin?e tomando los medicamentos que fueron suspendidos antes de la cirug?a, a menos que su doctor le indique lo contrario. ?Vaya a todas sus citas de seguimiento que ya fueron programadas. ? ? ?P?ngase en contacto con un proveedor de atenci?n m?dica si: ?Le aparecen m?s moretones alrededor del ojo. ?Tiene dolor que no se Systems analyst. ?Tiene fiebre. ?Le sale l?quido, pus o sangre del ojo o de la incisi?n. ?Su sensibilidad a la Actor. ?Tiene manchas (moscas volantes) o destellos de luz en la vista. ?Tiene n?useas o v?mitos. ? ?Vaya al Departamento de Emergencias m?s cercana o llame al 911 si: ?Pierde la vista de forma repentina. ?Tiene un dolor de ojo que es intenso y que se est? empeorando. ?

## 2024-07-25 NOTE — Anesthesia Preprocedure Evaluation (Signed)
 Anesthesia Evaluation  Patient identified by MRN, date of birth, ID band Patient awake    Reviewed: Allergy & Precautions, H&P , NPO status , Patient's Chart, lab work & pertinent test results  Airway Mallampati: IV  TM Distance: >3 FB Neck ROM: Full    Dental no notable dental hx. 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 Shellie  Hypertension Hypercholesteremia PVD (peripheral vascular disease)  Aortic atherosclerosis Osteoporosis Wears dentures    Reproductive/Obstetrics negative OB ROS                              Anesthesia Physical Anesthesia Plan  ASA: 3  Anesthesia Plan: MAC   Post-op Pain Management:    Induction: Intravenous  PONV Risk Score and Plan:   Airway Management Planned: Natural Airway and Nasal Cannula  Additional Equipment:   Intra-op Plan:   Post-operative Plan:   Informed Consent: I have reviewed the patients History and Physical, chart, labs and discussed the procedure including the  risks, benefits and alternatives for the proposed anesthesia with the patient or authorized representative who has indicated his/her understanding and acceptance.     Dental Advisory Given  Plan Discussed with: Anesthesiologist, CRNA and Surgeon  Anesthesia Plan Comments: (Patient consented for risks of anesthesia including but not limited to:  - adverse reactions to medications - damage to eyes, teeth, lips or other oral mucosa - nerve damage due to positioning  - sore throat or hoarseness - Damage to heart, brain, nerves, lungs, other parts of body or loss of life  Patient voiced understanding and assent.)         Anesthesia Quick Evaluation

## 2024-07-26 ENCOUNTER — Other Ambulatory Visit: Payer: Self-pay

## 2024-07-26 ENCOUNTER — Encounter: Admission: RE | Disposition: A | Payer: Self-pay | Source: Home / Self Care | Attending: Ophthalmology

## 2024-07-26 ENCOUNTER — Ambulatory Visit: Admitting: Anesthesiology

## 2024-07-26 ENCOUNTER — Encounter: Payer: Self-pay | Admitting: Ophthalmology

## 2024-07-26 ENCOUNTER — Ambulatory Visit
Admission: RE | Admit: 2024-07-26 | Discharge: 2024-07-26 | Disposition: A | Payer: PRIVATE HEALTH INSURANCE | Source: Home / Self Care | Attending: Ophthalmology | Admitting: Ophthalmology

## 2024-07-26 DIAGNOSIS — I739 Peripheral vascular disease, unspecified: Secondary | ICD-10-CM | POA: Diagnosis not present

## 2024-07-26 DIAGNOSIS — H2511 Age-related nuclear cataract, right eye: Secondary | ICD-10-CM | POA: Diagnosis present

## 2024-07-26 DIAGNOSIS — I10 Essential (primary) hypertension: Secondary | ICD-10-CM | POA: Diagnosis not present

## 2024-07-26 HISTORY — PX: CATARACT EXTRACTION W/PHACO: SHX586

## 2024-07-26 SURGERY — PHACOEMULSIFICATION, CATARACT, WITH IOL INSERTION
Anesthesia: Monitor Anesthesia Care | Laterality: Right

## 2024-07-26 MED ORDER — FENTANYL CITRATE (PF) 100 MCG/2ML IJ SOLN
INTRAMUSCULAR | Status: DC | PRN
  Administered 2024-07-26: 12:00:00 50 ug via INTRAVENOUS

## 2024-07-26 MED ORDER — PHENYLEPHRINE HCL 10 % OP SOLN
OPHTHALMIC | Status: AC
  Filled 2024-07-26: qty 5

## 2024-07-26 MED ORDER — LACTATED RINGERS IV SOLN: INTRAVENOUS | Status: DC

## 2024-07-26 MED ORDER — CYCLOPENTOLATE HCL 2 % OP SOLN
1.0000 [drp] | OPHTHALMIC | Status: DC | PRN
  Administered 2024-07-26 (×2): 1 [drp] via OPHTHALMIC

## 2024-07-26 MED ORDER — CYCLOPENTOLATE HCL 2 % OP SOLN
OPHTHALMIC | Status: AC
  Filled 2024-07-26: qty 2

## 2024-07-26 MED ORDER — TETRACAINE HCL 0.5 % OP SOLN
OPHTHALMIC | Status: AC
  Filled 2024-07-26: qty 4

## 2024-07-26 MED ORDER — SIGHTPATH DOSE#1 BSS IO SOLN
INTRAOCULAR | Status: DC | PRN
  Administered 2024-07-26: 12:00:00 15 mL via INTRAOCULAR

## 2024-07-26 MED ORDER — TRYPAN BLUE 0.06 % IO SOSY
PREFILLED_SYRINGE | INTRAOCULAR | Status: DC | PRN
  Administered 2024-07-26: 12:00:00 .5 mL via INTRAOCULAR

## 2024-07-26 MED ORDER — TETRACAINE HCL 0.5 % OP SOLN
1.0000 [drp] | OPHTHALMIC | Status: AC | PRN
  Administered 2024-07-26 (×3): 1 [drp] via OPHTHALMIC

## 2024-07-26 MED ORDER — PHENYLEPHRINE HCL 10 % OP SOLN
1.0000 [drp] | OPHTHALMIC | Status: AC | PRN
  Administered 2024-07-26 (×3): 1 [drp] via OPHTHALMIC

## 2024-07-26 MED ORDER — BALANCED SALT IO SOLN
INTRAOCULAR | Status: DC | PRN
  Administered 2024-07-26: 12:00:00 1 mL via INTRAOCULAR

## 2024-07-26 MED ORDER — MIDAZOLAM HCL 2 MG/2ML IJ SOLN
INTRAMUSCULAR | Status: AC
  Filled 2024-07-26: qty 2

## 2024-07-26 MED ORDER — SIGHTPATH DOSE#1 BSS IO SOLN
INTRAOCULAR | Status: DC | PRN
  Administered 2024-07-26: 12:00:00 102 mL via OPHTHALMIC

## 2024-07-26 MED ORDER — SIGHTPATH DOSE#1 NA HYALUR & NA CHOND-NA HYALUR IO KIT
PACK | INTRAOCULAR | Status: DC | PRN
  Administered 2024-07-26: 12:00:00 1 via OPHTHALMIC

## 2024-07-26 MED ORDER — MIDAZOLAM HCL (PF) 2 MG/2ML IJ SOLN
INTRAMUSCULAR | Status: DC | PRN
  Administered 2024-07-26 (×2): 1 mg via INTRAVENOUS

## 2024-07-26 MED ORDER — BRIMONIDINE TARTRATE-TIMOLOL 0.2-0.5 % OP SOLN
OPHTHALMIC | Status: DC | PRN
  Administered 2024-07-26: 12:00:00 1 [drp] via OPHTHALMIC

## 2024-07-26 MED ADMIN — Moxifloxacin HCl Ophth Soln 0.5% (Base Equiv): .2 mL | OPHTHALMIC | @ 12:00:00 | NDC 00781713593

## 2024-07-26 MED FILL — Fentanyl Citrate Preservative Free (PF) Inj 100 MCG/2ML: INTRAMUSCULAR | Qty: 2 | Status: AC

## 2024-07-26 SURGICAL SUPPLY — 10 items
CANNULA ANT/CHMB 27G (MISCELLANEOUS) IMPLANT
CANNULA HYDRODISSEC NUC 25X7/8 (MISCELLANEOUS) IMPLANT
FEE CATARACT SUITE SIGHTPATH (MISCELLANEOUS) ×1 IMPLANT
GLOVE BIOGEL PI IND STRL 8 (GLOVE) ×1 IMPLANT
GLOVE SURG LX STRL 7.5 STRW (GLOVE) ×1 IMPLANT
GLOVE SURG SYN 6.5 PF PI BL (GLOVE) ×1 IMPLANT
LENS IOL CLRN CLEAR 22.5 IMPLANT
NDL FILTER BLUNT 18X1 1/2 (NEEDLE) ×1 IMPLANT
NEEDLE FILTER BLUNT 18X1 1/2 (NEEDLE) ×1 IMPLANT
SYR 3ML LL SCALE MARK (SYRINGE) ×1 IMPLANT

## 2024-07-26 NOTE — H&P (Signed)
 [facility]   Primary Care Physician:  Lenon Layman ORN, MD Ophthalmologist: Dr. Curtistine Fava  Pre-Procedure History & Physical: HPI:  Anna Campbell is a 83 y.o. female here for cataract surgery right eye.   Past Medical History:  Diagnosis Date   Aortic atherosclerosis    Hypercholesteremia    Hypertension    Osteoporosis    PVD (peripheral vascular disease)    Wears dentures    partial  upper    Past Surgical History:  Procedure Laterality Date   ABDOMINAL HYSTERECTOMY     BIOPSY  04/06/2023   Procedure: BIOPSY;  Surgeon: Maryruth Ole DASEN, MD;  Location: [facility];  Service: Endoscopy;;   CATARACT EXTRACTION W/PHACO Left 07/19/2024   Procedure: PHACOEMULSIFICATION, CATARACT, WITH IOL INSERTION 6.48 00:52.2;  Surgeon: Mittie Gaskin, MD;  Location: [facility];  Service: Ophthalmology;  Laterality: Left;   CHOLECYSTECTOMY     ESOPHAGOGASTRODUODENOSCOPY (EGD) WITH PROPOFOL  N/A 04/06/2023   Procedure: ESOPHAGOGASTRODUODENOSCOPY (EGD) WITH PROPOFOL ;  Surgeon: Maryruth Ole DASEN, MD;  Location: [facility] END[facility];  Service: Endoscopy;  Laterality: N/A;  SPANISH INTERPRETER   JOINT REPLACEMENT     OPEN REDUCTION INTERNAL FIXATION (ORIF) DISTAL RADIAL FRACTURE Right 10/12/2023   Procedure: OPEN REDUCTION INTERNAL FIXATION (ORIF) DISTAL RADIUS FRACTURE;  Surgeon: Kathlynn Sharper, MD;  Location: [facility];  Service: Orthopedics;  Laterality: Right;   SCLEROTHERAPY  2024    Prior to Admission medications  Medication Sig Start Date End Date Taking? Authorizing Provider  alendronate (FOSAMAX) 35 MG tablet Take 35 mg by mouth once a week. Take 1 tablet (35 mg total) by mouth every 7 (seven) days Take with a full glass of water. Do not lie down for the next 30 min. 09/04/21  Yes [provider]  ascorbic acid (VITAMIN C) 500 MG tablet Take 500 mg by mouth daily.   Yes [provider]  atorvastatin (LIPITOR) 40 MG tablet Take 40 mg by  mouth daily. 02/04/22  Yes [provider]  bisacodyl (DULCOLAX) 5 MG EC tablet Take 5 mg by mouth daily as needed for moderate constipation.   Yes [provider]  cetirizine (ZYRTEC) 10 MG tablet Take 10 mg by mouth daily as needed for allergies.   Yes [provider]  Cholecalciferol 50 MCG (2000 UT) TABS Take 2,000 Units by mouth daily.   Yes [provider]  folic acid  (FOLVITE ) 1 MG tablet Take 1 mg by mouth daily. 09/09/21  Yes [provider]  HYDROcodone -acetaminophen  (NORCO/VICODIN) 5-325 MG tablet Take 1 tablet by mouth every 6 (six) hours as needed for moderate pain (pain score 4-6). 10/12/23  Yes Kathlynn Sharper, MD  losartan-hydrochlorothiazide (HYZAAR) 100-25 MG tablet Take 1 tablet by mouth daily. 02/04/22  Yes [provider]  MAGNESIUM PO Take 1 tablet by mouth daily.   Yes [provider]  melatonin 3 MG TABS tablet Take 3 mg by mouth at bedtime as needed.   Yes [provider]  pyridOXINE (VITAMIN B6) 50 MG tablet Take 50 mg by mouth daily.   Yes [provider]  Turmeric (QC TUMERIC COMPLEX) 500 MG CAPS Take 1 tablet by mouth daily.   Yes [provider]  Vitamin A 2400 MCG (8000 UT) TABS Take 1 tablet by mouth daily.   Yes [provider]    Allergies as of 06/19/2024 - Review Complete 12/16/2023  Allergen Reaction Noted   Penicillin g Anaphylaxis 09/14/2021   Bactrim [sulfamethoxazole-trimethoprim] Rash 10/11/2023    Family  History  Problem Relation Age of Onset   Hypertension Mother     Social History   Socioeconomic History   Marital status: Unknown    Spouse name: Not on file   Number of children: Not on file   Years of education: Not on file   Highest education level: Not on file  Occupational History   Not on file  Tobacco Use   Smoking status: Never   Smokeless tobacco: Never  Vaping Use   Vaping status: Never Used  Substance and Sexual Activity   Alcohol  use: Never   Drug use: Never   Sexual activity: Not Currently  Other Topics Concern   Not on file  Social History Narrative   Not on file   Social Drivers of Health   Tobacco Use: Low Risk (07/26/2024)   Patient History    Smoking Tobacco Use: Never    Smokeless Tobacco Use: Never    Passive Exposure: Not on file  Financial Resource Strain: Low Risk  (06/29/2024)   Received from [facility]   Overall Financial Resource Strain (CARDIA)    Difficulty of Paying Living Expenses: Not hard at all  Food Insecurity: No Food Insecurity (06/29/2024)   Received from [facility]   Epic    Within the past 12 months, you worried that your food would run out before you got the money to buy more.: Never true    Within the past 12 months, the food you bought just didn't last and you didn't have money to get more.: Never true  Transportation Needs: No Transportation Needs (06/29/2024)   Received from [facility] - Transportation    In the past 12 months, has lack of transportation kept you from medical appointments or from getting medications?: No    Lack of Transportation (Non-Medical): No  Physical Activity: Not on file  Stress: Not on file  Social Connections: Not on file  Intimate Partner Violence: Not on file  Depression (EYV7-0): Not on file  Alcohol Screen: Not on file  Housing: Low Risk  (06/29/2024)   Received from [facility]   Epic    In the last 12 months, was there a time when you were not able to pay the mortgage or rent on time?: No    In the past 12 months, how many times have you moved where you were living?: 0    At any time in the past 12 months, were you homeless or living in a shelter (including now)?: No  Utilities: Not At Risk (06/29/2024)   Received from [facility]   Epic    In the past 12 months has the electric, gas, oil, or water company threatened to shut off  services in your home?: No  Health Literacy: Not on file    Review of Systems: See HPI, otherwise negative ROS  Physical Exam: BP (!) 167/91   Pulse (!) 58   Temp 97.8 F (36.6 C) (Temporal)   Wt 34 kg   SpO2 99%   BMI 12.87 kg/m  General:   Alert, cooperative in NAD Head:  Normocephalic and atraumatic. Respiratory:  Normal work of breathing. Cardiovascular:  RRR  Impression/Plan: Anna Campbell is here for cataract surgery.  Risks, benefits, limitations, and alternatives regarding cataract surgery have been reviewed with the patient.  Questions have been answered.  All parties agreeable.   Curtistine JINNY Fava, MD  07/26/2024, 11:22 AM

## 2024-07-26 NOTE — Anesthesia Postprocedure Evaluation (Signed)
 Anesthesia Post Note  Patient: Anna Campbell  Procedure(s) Performed: PHACOEMULSIFICATION, CATARACT, WITH IOL INSERTION 7.99, 00:59.8 (Right)  Patient location during evaluation: PACU Anesthesia Type: MAC Level of consciousness: awake and alert Pain management: pain level controlled Vital Signs Assessment: post-procedure vital signs reviewed and stable Respiratory status: spontaneous breathing, nonlabored ventilation, respiratory function stable and patient connected to nasal cannula oxygen Cardiovascular status: stable and blood pressure returned to baseline Postop Assessment: no apparent nausea or vomiting Anesthetic complications: no   No notable events documented.   Last Vitals:  Vitals:   07/26/24 1201 07/26/24 1205  BP: (!) 129/58 138/66  Pulse: (!) 58 63  Resp: 14 12  Temp: (!) 36.2 C   SpO2: 100% 99%    Last Pain:  Vitals:   07/26/24 1205  TempSrc:   PainSc: 0-No pain                 Khaidyn Staebell C Viaan Knippenberg

## 2024-07-26 NOTE — Transfer of Care (Signed)
 Immediate Anesthesia Transfer of Care Note  Patient: Anna Campbell  Procedure(s) Performed: PHACOEMULSIFICATION, CATARACT, WITH IOL INSERTION 7.99, 00:59.8 (Right)  Patient Location: PACU  Anesthesia Type: MAC  Level of Consciousness: awake, alert  and patient cooperative  Airway and Oxygen Therapy: Patient Spontanous Breathing and Patient connected to supplemental oxygen  Post-op Assessment: Post-op Vital signs reviewed, Patient's Cardiovascular Status Stable, Respiratory Function Stable, Patent Airway and No signs of Nausea or vomiting  Post-op Vital Signs: Reviewed and stable  Complications: No notable events documented.

## 2024-07-26 NOTE — Op Note (Signed)
 PREOPERATIVE DIAGNOSIS:  Nuclear sclerotic cataract of the right eye.   POSTOPERATIVE DIAGNOSIS:  Right Eye Cataract   OPERATIVE PROCEDURE: Phacoemulsification with IOL Implant Right Eye   SURGEON:  Curtistine Fava, MD   ANESTHESIA:  Anesthesiologist: Ola Donny BROCKS, MD CRNA: Jahoo, Sonia, CRNA  Monitored anesthesia care. Topical tetracaine  drops followed by 2% Xylocaine  jelly applied in the preoperative holding area 0.35ml of epi-Shugarcaine was instilled in the eye following the paracentesis.   COMPLICATIONS:  None.   TECHNIQUE:   Phacoemulsification divide and conquer   DESCRIPTION OF PROCEDURE:  The patient was examined and consented in the preoperative holding area where the aforementioned topical anesthesia was applied to the right eye and then brought back to the Operating Room where the right eye was prepped and draped in the usual sterile ophthalmic fashion and a lid speculum was placed. A paracentesis was created with the side port blade and the anterior chamber was filled with epi-Shugarcaine followed by trypand blue which was washed out with BSS followed by viscoelastic. A clear corneal incision was performed with the steel keratome. A continuous curvilinear capsulorrhexis was performed with a cystotome followed by the capsulorrhexis forceps. Hydrodissection and hydrodelineation were carried out with BSS on a blunt cannula. The lens was removed in a divide and conquer technique and the remaining cortical material was removed with the irrigation-aspiration handpiece. The capsular bag was inflated with viscoelastic and the +22.50 CC60WF lens was placed in the capsular bag without complication. The remaining viscoelastic was removed from the eye with the irrigation-aspiration handpiece. The wounds were hydrated. The anterior chamber was flushed with BSS and the eye was inflated to physiologic pressure. 0.24ml of Vigamox  was placed in the anterior chamber. The wounds were found to be water  tight. The eye was dressed with Combigan  and covered with a clear shield to be worn until the first postoperative day appointment. The patient was given protective glasses to wear throughout the day. The patient was also given drops with which to begin a drop regimen today and will follow-up with me in one day. Implant Name Type Inv. Item Serial No. Manufacturer Lot No. LRB No. Used Action  LENS IOL CLRN CLEAR 22.5 - [id number]  LENS IOL CLRN CLEAR 22.5 [id number] [employer]  Right 1 Implanted   Procedures: PHACOEMULSIFICATION, CATARACT, WITH IOL INSERTION 7.99, 00:59.8 (Right)  Electronically signed: Curtistine PARAS [facility] 07/26/2024 12:02 PM
